# Patient Record
Sex: Male | Born: 1935 | Race: White | Hispanic: No | Smoking: Former smoker
Health system: Southern US, Community
[De-identification: ages and names within clinical notes are randomized; demographics above are authoritative.]

## PROBLEM LIST (undated history)

## (undated) DIAGNOSIS — H9190 Unspecified hearing loss, unspecified ear: Secondary | ICD-10-CM

## (undated) DIAGNOSIS — I422 Other hypertrophic cardiomyopathy: Secondary | ICD-10-CM

## (undated) DIAGNOSIS — C61 Malignant neoplasm of prostate: Secondary | ICD-10-CM

## (undated) DIAGNOSIS — E039 Hypothyroidism, unspecified: Secondary | ICD-10-CM

## (undated) DIAGNOSIS — R011 Cardiac murmur, unspecified: Secondary | ICD-10-CM

## (undated) HISTORY — PX: OTHER SURGICAL HISTORY: SHX169

## (undated) HISTORY — DX: Malignant neoplasm of prostate: C61

## (undated) HISTORY — DX: Cardiac murmur, unspecified: R01.1

## (undated) HISTORY — DX: Other hypertrophic cardiomyopathy: I42.2

## (undated) HISTORY — DX: Hypothyroidism, unspecified: E03.9

---

## 2012-05-17 ENCOUNTER — Encounter: Payer: Self-pay | Admitting: Cardiovascular Disease

## 2012-07-19 ENCOUNTER — Ambulatory Visit (INDEPENDENT_AMBULATORY_CARE_PROVIDER_SITE_OTHER): Payer: Medicare HMO | Admitting: Cardiovascular Disease

## 2012-07-19 ENCOUNTER — Encounter: Payer: Self-pay | Admitting: Cardiovascular Disease

## 2012-07-19 VITALS — BP 138/66 | HR 52 | Ht 70.0 in | Wt 177.0 lb

## 2012-07-19 DIAGNOSIS — I421 Obstructive hypertrophic cardiomyopathy: Secondary | ICD-10-CM | POA: Insufficient documentation

## 2012-07-19 NOTE — Patient Instructions (Addendum)
Your physician wants you to follow-up in: 4 months. You will receive a reminder letter in the mail two months in advance. If you don't receive a letter, please call our office to schedule the follow-up appointment.  

## 2012-07-19 NOTE — Progress Notes (Signed)
   History of Present Illness: 79 yo male with history of prostate cancer s/p brachytherapy and XRT, hypertrophic cardiomyopathy, hypothyroidism here today to establish cardiology care. He has been followed by Dr. Sharyn Creamer ([facility]) for idiopathic sub-aortic stenosis. No prior CAD or MI. Possible TIA several years ago.   He tells me that has been feeling well. No chest pain or SOB. No near syncope or syncope.   Primary Care Physician: Alysia Penna, MD [facility])  Past Medical History  Diagnosis Date  . Hypertrophic cardiomyopathy   . Hypothyroidism   . Prostate cancer   . Heart murmur     Past Surgical History  Procedure Date  . Seed implants - prostate     Current Outpatient Prescriptions  Medication Sig Dispense Refill  . bisoprolol (ZEBETA) 5 MG tablet Take 5 mg by mouth daily.      Marland Kitchen levothyroxine (SYNTHROID, LEVOTHROID) 125 MCG tablet Take 125 mcg by mouth daily.      . Tamsulosin HCl (FLOMAX) 0.4 MG CAPS Take 0.4 mg by mouth daily after supper.        No Known Allergies  History   Social History  . Marital Status: N/A    Spouse Name: N/A    Number of Children: N/A  . Years of Education: N/A   Occupational History  . Not on file.   Social History Main Topics  . Smoking status: Not on file  . Smokeless tobacco: Not on file  . Alcohol Use: Not on file  . Drug Use: Not on file  . Sexually Active: Not on file   Other Topics Concern  . Not on file   Social History Narrative  . No narrative on file    No family history on file.  Review of Systems:  As stated in the HPI and otherwise negative.   BP 138/66  Pulse 52  Ht 5\' 10"  (1.778 m)  Wt 177 lb (80.287 kg)  BMI 25.40 kg/m2  Physical Examination: General: Well developed, well nourished, NAD HEENT: OP clear, mucus membranes moist SKIN: warm, dry. No rashes. Neuro: No focal deficits Musculoskeletal: Muscle strength 5/5 all ext Psychiatric: Mood and affect  normal Neck: No JVD, no carotid bruits, no thyromegaly, no lymphadenopathy. Lungs:Clear bilaterally, no wheezes, rhonci, crackles Cardiovascular: Regular rate and rhythm. Systolic murmurs noted. No gallops or rubs. Abdomen:Soft. Bowel sounds present. Non-tender.  Extremities: No lower extremity edema. Pulses are 2 + in the bilateral DP/PT.  EKG: Sinus brady, rate 52 bpm. LBBB. 1st degree AV block.   Assessment and Plan:   1. Hypertrophic Cardiomyopathy: He has been followed in [place] by Dr. Charlestine Massed. Will get records. No testing planned at this time. He has been seen by a specialist in HOCM in [place] and conservative management planned. BP is well controlled. Will review records and plan f/u in 4 months. Will plan testing at that time.

## 2012-07-28 ENCOUNTER — Encounter: Payer: Self-pay | Admitting: Cardiovascular Disease

## 2012-11-10 ENCOUNTER — Ambulatory Visit (INDEPENDENT_AMBULATORY_CARE_PROVIDER_SITE_OTHER): Payer: Medicare HMO | Admitting: Cardiovascular Disease

## 2012-11-10 ENCOUNTER — Encounter: Payer: Self-pay | Admitting: Cardiovascular Disease

## 2012-11-10 ENCOUNTER — Telehealth: Payer: Self-pay | Admitting: Cardiovascular Disease

## 2012-11-10 VITALS — BP 151/62 | HR 50 | Ht 70.0 in | Wt 175.0 lb

## 2012-11-10 DIAGNOSIS — I421 Obstructive hypertrophic cardiomyopathy: Secondary | ICD-10-CM

## 2012-11-10 NOTE — Telephone Encounter (Signed)
Records rec From [facility], gave to [facility] 11/10/12/KM

## 2012-11-10 NOTE — Patient Instructions (Addendum)

## 2012-11-10 NOTE — Progress Notes (Signed)
   History of Present Illness: 79 yo male with history of prostate cancer s/p brachytherapy and XRT, hypertrophic cardiomyopathy, hypothyroidism here today for cardiac follow up. I saw him as a new patient January 2014. He has been followed by Dr. Sharyn Creamer ([facility] [place]) for idiopathic sub-aortic stenosis. No prior CAD or MI. Possible TIA several years ago. We have requested records multiple times from Dr. Roland Earl office but have not received records yet. He moved from [place] in June 2013 to be near his daughter in [place] and son in [place].   He is here today for follow up. No chest pain or SOB. No near syncope or syncope. He has occasional dizziness.   Primary Care Physician: Alysia Penna, MD [facility])  Past Medical History  Diagnosis Date  . Hypertrophic cardiomyopathy   . Hypothyroidism   . Prostate cancer   . Heart murmur     Past Surgical History  Procedure Laterality Date  . Seed implants - prostate      Current Outpatient Prescriptions  Medication Sig Dispense Refill  . bisoprolol (ZEBETA) 5 MG tablet Take 5 mg by mouth daily.      Marland Kitchen levothyroxine (SYNTHROID, LEVOTHROID) 125 MCG tablet Take 125 mcg by mouth daily.      . Tamsulosin HCl (FLOMAX) 0.4 MG CAPS Take 0.4 mg by mouth daily after supper.       No current facility-administered medications for this visit.    No Known Allergies  History   Social History  . Marital Status: Widowed    Spouse Name: N/A    Number of Children: 4  . Years of Education: N/A   Occupational History  . Retired    Social History Main Topics  . Smoking status: Former Smoker -- 20 years    Quit date: 07/19/1973  . Smokeless tobacco: Not on file  . Alcohol Use: 3.5 oz/week    7 drink(s) per week  . Drug Use: No  . Sexually Active: Not on file   Other Topics Concern  . Not on file   Social History Narrative  . No narrative on file    Family History  Problem Relation Age of Onset  . Diabetes Mother      Review of Systems:  As stated in the HPI and otherwise negative.   BP 151/62  Pulse 50  Ht 5\' 10"  (1.778 m)  Wt 175 lb (79.379 kg)  BMI 25.11 kg/m2  Physical Examination: General: Well developed, well nourished, NAD HEENT: OP clear, mucus membranes moist SKIN: warm, dry. No rashes. Neuro: No focal deficits Musculoskeletal: Muscle strength 5/5 all ext Psychiatric: Mood and affect normal Neck: No JVD, no carotid bruits, no thyromegaly, no lymphadenopathy. Lungs:Clear bilaterally, no wheezes, rhonci, crackles Cardiovascular: Regular rate and rhythm. No murmurs, gallops or rubs. Abdomen:Soft. Bowel sounds present. Non-tender.  Extremities: No lower extremity edema. Pulses are 2 + in the bilateral DP/PT.  Assessment and Plan:   1. Hypertrophic Cardiomyopathy: He has been followed in [place] by Dr. Charlestine Massed. I would like to get the records to review to see if we need to alter his therapy but we have been unsuccessful in our attempts. Will arrange echo to assess his LV size and function, valve disease. He has been seen by a specialist in HOCM in [place] and conservative management planned. No changes today.

## 2012-12-19 ENCOUNTER — Ambulatory Visit (HOSPITAL_COMMUNITY): Payer: Medicare HMO | Attending: Cardiology | Admitting: Radiology

## 2012-12-19 DIAGNOSIS — I422 Other hypertrophic cardiomyopathy: Secondary | ICD-10-CM | POA: Insufficient documentation

## 2012-12-19 DIAGNOSIS — I119 Hypertensive heart disease without heart failure: Secondary | ICD-10-CM | POA: Insufficient documentation

## 2012-12-19 DIAGNOSIS — I421 Obstructive hypertrophic cardiomyopathy: Secondary | ICD-10-CM

## 2012-12-19 DIAGNOSIS — Z8673 Personal history of transient ischemic attack (TIA), and cerebral infarction without residual deficits: Secondary | ICD-10-CM | POA: Insufficient documentation

## 2012-12-19 NOTE — Progress Notes (Signed)
Echocardiogram performed.  

## 2013-05-16 ENCOUNTER — Ambulatory Visit (INDEPENDENT_AMBULATORY_CARE_PROVIDER_SITE_OTHER): Payer: Medicare HMO | Admitting: Cardiovascular Disease

## 2013-05-16 ENCOUNTER — Encounter: Payer: Self-pay | Admitting: Cardiovascular Disease

## 2013-05-16 VITALS — BP 123/60 | HR 63 | Ht 70.0 in | Wt 172.4 lb

## 2013-05-16 DIAGNOSIS — I447 Left bundle-branch block, unspecified: Secondary | ICD-10-CM

## 2013-05-16 DIAGNOSIS — I421 Obstructive hypertrophic cardiomyopathy: Secondary | ICD-10-CM

## 2013-05-16 NOTE — Progress Notes (Signed)
History of Present Illness: 79 yo male  with history of prostate cancer s/p brachytherapy and XRT, hypertrophic cardiomyopathy, hypothyroidism here today for cardiac follow up. I saw him as a new patient January 2014. He has been followed by Dr. Sharyn Creamer ([facility] [place] [facility]) for idiopathic sub-aortic stenosis. No prior CAD or MI. Possible TIA several years ago. He moved from [place] in June 2013 to be near his daughter in [place] and son in [place]. Echo June 2014 with findings c/w HOCM (see below).  He is here today for follow up. No chest pain or SOB. No near syncope or syncope. He occasionally feels his heart skip beats. Lasts a few seconds. No prolonged episodes. No associated dizziness.  His name is pronounced (Juh-leen).   Primary Care Physician: Alysia Penna, MD [facility])  Past Medical History  Diagnosis Date  . Hypertrophic cardiomyopathy   . Hypothyroidism   . Prostate cancer   . Heart murmur     Past Surgical History  Procedure Laterality Date  . Seed implants - prostate      Current Outpatient Prescriptions  Medication Sig Dispense Refill  . bisoprolol (ZEBETA) 5 MG tablet Take 5 mg by mouth daily.      Marland Kitchen levothyroxine (SYNTHROID, LEVOTHROID) 125 MCG tablet Take 125 mcg by mouth daily.      . Tamsulosin HCl (FLOMAX) 0.4 MG CAPS Take 0.4 mg by mouth daily after supper.       No current facility-administered medications for this visit.    No Known Allergies  History   Social History  . Marital Status: Widowed    Spouse Name: N/A    Number of Children: 4  . Years of Education: N/A   Occupational History  . Retired    Social History Main Topics  . Smoking status: Former Smoker -- 20 years    Quit date: 07/19/1973  . Smokeless tobacco: Not on file  . Alcohol Use: 3.5 oz/week    7 drink(s) per week  . Drug Use: No  . Sexual Activity: Not on file   Other Topics Concern  . Not on file   Social History Narrative  . No narrative on  file    Family History  Problem Relation Age of Onset  . Diabetes Mother     Review of Systems:  As stated in the HPI and otherwise negative.   BP 123/60  Pulse 63  Ht 5\' 10"  (1.778 m)  Wt 172 lb 6.4 oz (78.2 kg)  BMI 24.74 kg/m2  Physical Examination: General: Well developed, well nourished, NAD HEENT: OP clear, mucus membranes moist SKIN: warm, dry. No rashes. Neuro: No focal deficits Musculoskeletal: Muscle strength 5/5 all ext Psychiatric: Mood and affect normal Neck: No JVD, no carotid bruits, no thyromegaly, no lymphadenopathy. Lungs:Clear bilaterally, no wheezes, rhonci, crackles Cardiovascular: Regular rate and rhythm. No murmurs, gallops or rubs. Abdomen:Soft. Bowel sounds present. Non-tender.  Extremities: No lower extremity edema. Pulses are 2 + in the bilateral DP/PT.  EKG: Sinus, rate 63 bpm. 1st degree AV block. LBBB.   Echo June 2014: Left ventricle: The cavity size was normal. Severe asymmetric septal hypertrophy. Systolic function was low normal to mildly reduced. The estimated ejection fraction was in the range of 50% to 55%. There was dynamic obstruction at rest and during Valsalva in the outflow tract, with a peak gradient of 55 mmHg at rest and a peak gradient of174mm Hg with Valsalva. Wall motion was normal; there were no  regional wall motion abnormalities. Doppler parameters are consistent with abnormal left ventricular relaxation (grade 1 diastolic dysfunction). Septal thickness:8mm (ED, 2D). Posterior wall thickness:72mm (ED, 2D). - Aortic valve: Trileaflet; moderately calcified leaflets. There was mild stenosis. Trivial regurgitation. Mean gradient: 17mm Hg (S). Peak gradient: 28mm Hg (S). - Aorta: Mildly dilated aortic root. Aortic root dimension: 39mm (ED). - Mitral valve: There was mitral systolic anterior motion. Moderately calcified annulus. Moderately calcified leaflets . Mild regurgitation. - Left atrium: The atrium was mildly to  moderately dilated. - Right ventricle: The cavity size was normal. Systolic function was normal. - Tricuspid valve: Peak RV-RA gradient: 25mm Hg (S). - Pulmonary arteries: PA peak pressure: 30mm Hg (S). - Inferior vena cava: The vessel was normal in size; the respirophasic diameter changes were in the normal range (= 50%); findings are consistent with normal central venous pressure. Impressions:  - Normal LV size with severe asymmetric septal hypertrophy. EF 50-55%, low normal to mildly reduced. There was mitral systolic anterior motion with mild MR and LVOT gradient to 55 mmHg at rest and 102 mmHg with Valsalva. There was mild valvular aortic stenosis. Normal RV size and systolic function. Findings consistent with hypertrophic obstructive cardiomyopathy.  Assessment and Plan:   1. Hypertrophic Cardiomyopathy: Recent echo with normal LV function, septal hypertrophy and SAM, c/w HOCM. He has been seen by a specialist in HOCM in [place] and conservative management planned. No changes today. I have encouraged him to let us know if he has any palpitations, episodes of dizziness.   2. LBBB: Chronic.

## 2013-05-16 NOTE — Patient Instructions (Signed)
Your physician wants you to follow-up in:  12 months.  You will receive a reminder letter in the mail two months in advance. If you don't receive a letter, please call our office to schedule the follow-up appointment.   

## 2014-05-24 ENCOUNTER — Ambulatory Visit (INDEPENDENT_AMBULATORY_CARE_PROVIDER_SITE_OTHER): Payer: Commercial Managed Care - HMO | Admitting: Cardiovascular Disease

## 2014-05-24 ENCOUNTER — Encounter: Payer: Self-pay | Admitting: Cardiovascular Disease

## 2014-05-24 VITALS — BP 130/56 | HR 50 | Ht 70.0 in | Wt 174.4 lb

## 2014-05-24 DIAGNOSIS — I421 Obstructive hypertrophic cardiomyopathy: Secondary | ICD-10-CM

## 2014-05-24 DIAGNOSIS — I447 Left bundle-branch block, unspecified: Secondary | ICD-10-CM

## 2014-05-24 NOTE — Progress Notes (Signed)
History of Present Illness: 80 yo male (Juh-leen) with history of prostate cancer s/p brachytherapy and XRT, hypertrophic cardiomyopathy, hypothyroidism here today for cardiac follow up. I saw him as a new patient January 2014. He has been followed by Dr. Cory Roughen ([facility] [place]) for idiopathic sub-aortic stenosis. No prior CAD or MI. Possible TIA several years ago. He moved from [place] in June 2013 to be near his daughter in [place] and son in [place]. Echo June 2014 with findings c/w HOCM.   He is here today for follow up. No chest pain or SOB. No near syncope or syncope.    Primary Care Physician: Velna Hatchet, MD ([place])  Past Medical History  Diagnosis Date  . Hypertrophic cardiomyopathy   . Hypothyroidism   . Prostate cancer   . Heart murmur     Past Surgical History  Procedure Laterality Date  . Seed implants - prostate      Current Outpatient Prescriptions  Medication Sig Dispense Refill  . bisoprolol (ZEBETA) 5 MG tablet Take 5 mg by mouth daily.    Marland Kitchen levothyroxine (SYNTHROID, LEVOTHROID) 125 MCG tablet Take 125 mcg by mouth daily.    . Tamsulosin HCl (FLOMAX) 0.4 MG CAPS Take 0.4 mg by mouth daily after supper.     No current facility-administered medications for this visit.    No Known Allergies  History   Social History  . Marital Status: Widowed    Spouse Name: N/A    Number of Children: 4  . Years of Education: N/A   Occupational History  . Retired    Social History Main Topics  . Smoking status: Former Smoker -- 20 years    Quit date: 07/19/1973  . Smokeless tobacco: Not on file  . Alcohol Use: 3.5 oz/week    7 drink(s) per week  . Drug Use: No  . Sexual Activity: Not on file   Other Topics Concern  . Not on file   Social History Narrative    Family History  Problem Relation Age of Onset  . Diabetes Mother     Review of Systems:  As stated in the HPI and otherwise negative.   BP 130/56 mmHg  Pulse 50   Ht 5\' 10"  (1.778 m)  Wt 174 lb 6.4 oz (79.107 kg)  BMI 25.02 kg/m2  SpO2 99%  Physical Examination: General: Well developed, well nourished, NAD HEENT: OP clear, mucus membranes moist SKIN: warm, dry. No rashes. Neuro: No focal deficits Musculoskeletal: Muscle strength 5/5 all ext Psychiatric: Mood and affect normal Neck: No JVD, no carotid bruits, no thyromegaly, no lymphadenopathy. Lungs:Clear bilaterally, no wheezes, rhonci, crackles Cardiovascular: Regular rate and rhythm. No murmurs, gallops or rubs. Abdomen:Soft. Bowel sounds present. Non-tender.  Extremities: No lower extremity edema. Pulses are 2 + in the bilateral DP/PT.  EKG: Sinus, rate 50 bpm. 1st degree AV block. LBBB.   Echo June 2014: Left ventricle: The cavity size was normal. Severe asymmetric septal hypertrophy. Systolic function was low normal to mildly reduced. The estimated ejection fraction was in the range of 50% to 55%. There was dynamic obstruction at rest and during Valsalva in the outflow tract, with a peak gradient of 55 mmHg at rest and a peak gradient of114mm Hg with Valsalva. Wall motion was normal; there were no regional wall motion abnormalities. Doppler parameters are consistent with abnormal left ventricular relaxation (grade 1 diastolic dysfunction). Septal thickness:62mm (ED, 2D). Posterior wall thickness:69mm (ED, 2D). - Aortic valve: Trileaflet; moderately  calcified leaflets. There was mild stenosis. Trivial regurgitation. Mean gradient: 72mm Hg (S). Peak gradient: 75mm Hg (S). - Aorta: Mildly dilated aortic root. Aortic root dimension: 103mm (ED). - Mitral valve: There was mitral systolic anterior motion. Moderately calcified annulus. Moderately calcified leaflets . Mild regurgitation. - Left atrium: The atrium was mildly to moderately dilated. - Right ventricle: The cavity size was normal. Systolic function was normal. - Tricuspid valve: Peak RV-RA gradient: 71mm Hg (S). - Pulmonary  arteries: PA peak pressure: 43mm Hg (S). - Inferior vena cava: The vessel was normal in size; the respirophasic diameter changes were in the normal range (= 50%); findings are consistent with normal central venous pressure. Impressions:  - Normal LV size with severe asymmetric septal hypertrophy. EF 50-55%, low normal to mildly reduced. There was mitral systolic anterior motion with mild MR and LVOT gradient to 55 mmHg at rest and 102 mmHg with Valsalva. There was mild valvular aortic stenosis. Normal RV size and systolic function. Findings consistent with hypertrophic obstructive cardiomyopathy.  Assessment and Plan:   1. Hypertrophic Cardiomyopathy: Echo 2014 with normal LV function, septal hypertrophy and SAM, c/w HOCM. He has been seen by a specialist in [place] in [place] and conservative management planned. He does not wish to have any future invasive testing or procedures. He states that he feels great and accepts the fact that he may not live longer than a few more years. No changes today. I have encouraged him to let us know if he has any palpitations, episodes of dizziness.   2. LBBB: Chronic.

## 2014-05-24 NOTE — Patient Instructions (Signed)
Your physician wants you to follow-up in:  12 months.  You will receive a reminder letter in the mail two months in advance. If you don't receive a letter, please call our office to schedule the follow-up appointment.   

## 2015-06-05 ENCOUNTER — Encounter: Payer: Self-pay | Admitting: Cardiovascular Disease

## 2015-06-05 ENCOUNTER — Ambulatory Visit (INDEPENDENT_AMBULATORY_CARE_PROVIDER_SITE_OTHER): Payer: Commercial Managed Care - HMO | Admitting: Cardiovascular Disease

## 2015-06-05 VITALS — BP 112/60 | HR 52 | Ht 70.0 in | Wt 172.0 lb

## 2015-06-05 DIAGNOSIS — I447 Left bundle-branch block, unspecified: Secondary | ICD-10-CM

## 2015-06-05 DIAGNOSIS — I421 Obstructive hypertrophic cardiomyopathy: Secondary | ICD-10-CM | POA: Diagnosis not present

## 2015-06-05 NOTE — Progress Notes (Signed)
Chief Complaint  Patient presents with  . Follow-up     History of Present Illness: 81 yo male (Edward Foster) with history of prostate cancer s/p brachytherapy and XRT, hypertrophic cardiomyopathy, hypothyroidism here today for cardiac follow up. I saw him as a new patient January 2014. He has been followed by Dr. Cory Roughen ([facility] [place]) for idiopathic sub-aortic stenosis. No prior CAD or MI. Possible TIA several years ago. He moved from [place] in June 2013 to be near his daughter in [place] and son in [place]. Echo June 2014 with findings c/w HOCM.   He is here today for follow up. No chest pain or SOB. No near syncope or syncope.    Primary Care Physician: Velna Hatchet, MD ([place])  Past Medical History  Diagnosis Date  . Hypertrophic cardiomyopathy ([place])   . Hypothyroidism   . Prostate cancer ([place])   . Heart murmur     Past Surgical History  Procedure Laterality Date  . Seed implants - prostate      Current Outpatient Prescriptions  Medication Sig Dispense Refill  . bisoprolol (ZEBETA) 5 MG tablet Take 5 mg by mouth daily.    Marland Kitchen levothyroxine (SYNTHROID, LEVOTHROID) 125 MCG tablet Take 125 mcg by mouth daily.    . Tamsulosin HCl (FLOMAX) 0.4 MG CAPS Take 0.4 mg by mouth daily after supper.     No current facility-administered medications for this visit.    No Known Allergies  Social History   Social History  . Marital Status: Widowed    Spouse Name: N/A  . Number of Children: 4  . Years of Education: N/A   Occupational History  . Retired    Social History Main Topics  . Smoking status: Former Smoker -- 20 years    Quit date: 07/19/1973  . Smokeless tobacco: Not on file  . Alcohol Use: 3.5 oz/week    7 drink(s) per week  . Drug Use: No  . Sexual Activity: Not on file   Other Topics Concern  . Not on file   Social History Narrative    Family History  Problem Relation Age of Onset  . Diabetes Mother     Review of Systems:   As stated in the HPI and otherwise negative.   BP 112/60 mmHg  Pulse 52  Ht 5\' 10"  (1.778 m)  Wt 172 lb (78.019 kg)  BMI 24.68 kg/m2  Physical Examination: General: Well developed, well nourished, NAD HEENT: OP clear, mucus membranes moist SKIN: warm, dry. No rashes. Neuro: No focal deficits Musculoskeletal: Muscle strength 5/5 all ext Psychiatric: Mood and affect normal Neck: No JVD, no carotid bruits, no thyromegaly, no lymphadenopathy. Lungs:Clear bilaterally, no wheezes, rhonci, crackles Cardiovascular: Regular rate and rhythm. Systolic murmur noted. No gallops or rubs. Abdomen:Soft. Bowel sounds present. Non-tender.  Extremities: No lower extremity edema. Pulses are 2 + in the bilateral DP/PT.  EKG: Sinus, rate 52 bpm. 1st degree AV block. LBBB.   Echo June 2014: Left ventricle: The cavity size was normal. Severe asymmetric septal hypertrophy. Systolic function was low normal to mildly reduced. The estimated ejection fraction was in the range of 50% to 55%. There was dynamic obstruction at rest and during Valsalva in the outflow tract, with a peak gradient of 55 mmHg at rest and a peak gradient of19mm Hg with Valsalva. Wall motion was normal; there were no regional wall motion abnormalities. Doppler parameters are consistent with abnormal left ventricular relaxation (grade 1 diastolic dysfunction). Septal thickness:79mm (ED,  2D). Posterior wall thickness:57mm (ED, 2D). - Aortic valve: Trileaflet; moderately calcified leaflets. There was mild stenosis. Trivial regurgitation. Mean gradient: 60mm Hg (S). Peak gradient: 45mm Hg (S). - Aorta: Mildly dilated aortic root. Aortic root dimension: 34mm (ED). - Mitral valve: There was mitral systolic anterior motion. Moderately calcified annulus. Moderately calcified leaflets . Mild regurgitation. - Left atrium: The atrium was mildly to moderately dilated. - Right ventricle: The cavity size was normal. Systolic function was  normal. - Tricuspid valve: Peak RV-RA gradient: 16mm Hg (S). - Pulmonary arteries: PA peak pressure: 1mm Hg (S). - Inferior vena cava: The vessel was normal in size; the respirophasic diameter changes were in the normal range (= 50%); findings are consistent with normal central venous pressure. Impressions:  - Normal LV size with severe asymmetric septal hypertrophy. EF 50-55%, low normal to mildly reduced. There was mitral systolic anterior motion with mild MR and LVOT gradient to 55 mmHg at rest and 102 mmHg with Valsalva. There was mild valvular aortic stenosis. Normal RV size and systolic function. Findings consistent with hypertrophic obstructive cardiomyopathy.  EKG:  EKG is ordered today. The ekg ordered today demonstrates sinus brady, rate 52 bpm. 1st degree AV block. LBBB  Recent Labs: No results found for requested labs within last 365 days.   Lipid Panel No results found for: CHOL, TRIG, HDL, CHOLHDL, VLDL, LDLCALC, LDLDIRECT   Wt Readings from Last 3 Encounters:  06/05/15 172 lb (78.019 kg)  05/24/14 174 lb 6.4 oz (79.107 kg)  05/16/13 172 lb 6.4 oz (78.2 kg)     Other studies Reviewed: Additional studies/ records that were reviewed today include: . Review of the above records demonstrates:    Assessment and Plan:   1. Hypertrophic Cardiomyopathy: Echo 2014 with normal LV function, septal hypertrophy and SAM, c/w HOCM. He has been seen by a specialist in [place] in [place] and conservative management planned. He does not wish to have any future invasive testing or procedures. He states that he feels great. No changes today. Will continue beta blocker and he will continue to push po fluids. I have encouraged him to let us know if he has any palpitations, episodes of dizziness.   2. LBBB: Chronic.   Current medicines are reviewed at length with the patient today.  The patient does not have concerns regarding medicines.  The following changes have been made:  no  change  Labs/ tests ordered today include:   Orders Placed This Encounter  Procedures  . EKG 12-Lead    Disposition:   FU with me in 12  months  Signed, Lauree [place], MD 06/05/2015 3:52 PM    [facility] [address], [place], [place]  [address] Phone: [phone]; Fax: [phone]

## 2015-06-05 NOTE — Patient Instructions (Signed)

## 2015-07-16 DIAGNOSIS — L821 Other seborrheic keratosis: Secondary | ICD-10-CM | POA: Diagnosis not present

## 2015-07-16 DIAGNOSIS — D1801 Hemangioma of skin and subcutaneous tissue: Secondary | ICD-10-CM | POA: Diagnosis not present

## 2015-07-16 DIAGNOSIS — D225 Melanocytic nevi of trunk: Secondary | ICD-10-CM | POA: Diagnosis not present

## 2015-07-16 DIAGNOSIS — L814 Other melanin hyperpigmentation: Secondary | ICD-10-CM | POA: Diagnosis not present

## 2015-07-24 DIAGNOSIS — H60331 Swimmer's ear, right ear: Secondary | ICD-10-CM | POA: Diagnosis not present

## 2015-07-24 DIAGNOSIS — H903 Sensorineural hearing loss, bilateral: Secondary | ICD-10-CM | POA: Diagnosis not present

## 2015-08-07 DIAGNOSIS — R0982 Postnasal drip: Secondary | ICD-10-CM | POA: Diagnosis not present

## 2015-08-07 DIAGNOSIS — J310 Chronic rhinitis: Secondary | ICD-10-CM | POA: Diagnosis not present

## 2015-08-07 DIAGNOSIS — H60331 Swimmer's ear, right ear: Secondary | ICD-10-CM | POA: Diagnosis not present

## 2016-03-11 DIAGNOSIS — R32 Unspecified urinary incontinence: Secondary | ICD-10-CM | POA: Diagnosis not present

## 2016-03-11 DIAGNOSIS — C61 Malignant neoplasm of prostate: Secondary | ICD-10-CM | POA: Diagnosis not present

## 2016-03-11 DIAGNOSIS — N401 Enlarged prostate with lower urinary tract symptoms: Secondary | ICD-10-CM | POA: Diagnosis not present

## 2016-03-12 DIAGNOSIS — H353131 Nonexudative age-related macular degeneration, bilateral, early dry stage: Secondary | ICD-10-CM | POA: Diagnosis not present

## 2016-03-12 DIAGNOSIS — H52223 Regular astigmatism, bilateral: Secondary | ICD-10-CM | POA: Diagnosis not present

## 2016-03-12 DIAGNOSIS — H524 Presbyopia: Secondary | ICD-10-CM | POA: Diagnosis not present

## 2016-03-12 DIAGNOSIS — Z961 Presence of intraocular lens: Secondary | ICD-10-CM | POA: Diagnosis not present

## 2016-03-12 DIAGNOSIS — H5212 Myopia, left eye: Secondary | ICD-10-CM | POA: Diagnosis not present

## 2016-03-12 DIAGNOSIS — H5201 Hypermetropia, right eye: Secondary | ICD-10-CM | POA: Diagnosis not present

## 2016-03-12 DIAGNOSIS — H353132 Nonexudative age-related macular degeneration, bilateral, intermediate dry stage: Secondary | ICD-10-CM | POA: Diagnosis not present

## 2016-04-04 DIAGNOSIS — Z23 Encounter for immunization: Secondary | ICD-10-CM | POA: Diagnosis not present

## 2016-07-14 DIAGNOSIS — Z125 Encounter for screening for malignant neoplasm of prostate: Secondary | ICD-10-CM | POA: Diagnosis not present

## 2016-07-14 DIAGNOSIS — E781 Pure hyperglyceridemia: Secondary | ICD-10-CM | POA: Diagnosis not present

## 2016-07-14 DIAGNOSIS — E038 Other specified hypothyroidism: Secondary | ICD-10-CM | POA: Diagnosis not present

## 2016-07-14 DIAGNOSIS — I10 Essential (primary) hypertension: Secondary | ICD-10-CM | POA: Diagnosis not present

## 2016-07-14 DIAGNOSIS — M859 Disorder of bone density and structure, unspecified: Secondary | ICD-10-CM | POA: Diagnosis not present

## 2016-07-31 DIAGNOSIS — M353 Polymyalgia rheumatica: Secondary | ICD-10-CM | POA: Diagnosis not present

## 2016-07-31 DIAGNOSIS — Z0000 Encounter for general adult medical examination without abnormal findings: Secondary | ICD-10-CM | POA: Diagnosis not present

## 2016-07-31 DIAGNOSIS — Z6825 Body mass index (BMI) 25.0-25.9, adult: Secondary | ICD-10-CM | POA: Diagnosis not present

## 2016-07-31 DIAGNOSIS — N183 Chronic kidney disease, stage 3 (moderate): Secondary | ICD-10-CM | POA: Diagnosis not present

## 2016-07-31 DIAGNOSIS — I10 Essential (primary) hypertension: Secondary | ICD-10-CM | POA: Diagnosis not present

## 2016-07-31 DIAGNOSIS — M859 Disorder of bone density and structure, unspecified: Secondary | ICD-10-CM | POA: Diagnosis not present

## 2016-07-31 DIAGNOSIS — E038 Other specified hypothyroidism: Secondary | ICD-10-CM | POA: Diagnosis not present

## 2016-07-31 DIAGNOSIS — N400 Enlarged prostate without lower urinary tract symptoms: Secondary | ICD-10-CM | POA: Diagnosis not present

## 2016-07-31 DIAGNOSIS — G629 Polyneuropathy, unspecified: Secondary | ICD-10-CM | POA: Diagnosis not present

## 2016-07-31 DIAGNOSIS — I421 Obstructive hypertrophic cardiomyopathy: Secondary | ICD-10-CM | POA: Diagnosis not present

## 2016-07-31 DIAGNOSIS — Z23 Encounter for immunization: Secondary | ICD-10-CM | POA: Diagnosis not present

## 2016-07-31 DIAGNOSIS — Z1389 Encounter for screening for other disorder: Secondary | ICD-10-CM | POA: Diagnosis not present

## 2016-08-12 DIAGNOSIS — H903 Sensorineural hearing loss, bilateral: Secondary | ICD-10-CM | POA: Diagnosis not present

## 2016-08-12 DIAGNOSIS — J310 Chronic rhinitis: Secondary | ICD-10-CM | POA: Diagnosis not present

## 2016-08-12 DIAGNOSIS — R0982 Postnasal drip: Secondary | ICD-10-CM | POA: Diagnosis not present

## 2016-08-12 DIAGNOSIS — H6121 Impacted cerumen, right ear: Secondary | ICD-10-CM | POA: Diagnosis not present

## 2016-11-03 NOTE — Progress Notes (Signed)
Cardiology Office Note:    Date:  11/04/2016   ID:  Edward Foster, DOB 21-Oct-1922, MRN [id number]  PCP:  Edward Hatchet, MD  Cardiologist:  Dr. Lauree Foster   Electrophysiologist:  n/a  Referring MD: Edward Hatchet, MD   Chief Complaint  Patient presents with  . Near Syncope  . Chest Pain    History of Present Illness:    Edward Foster is a 83 y.o. male with a hx of prostate cancer s/p brachytherapy and XRT, hypertrophic obstructive cardiomyopathy, hypothyroidism, possible previous TIA.  Previously followed in [place] for idiopathic subaortic stenosis. He established with Dr. Angelena Foster in 1/14 after moving from [place]. Last seen by Dr. Angelena Foster 11/16.  He returns today for evaluation of near syncope and chest pain.  He was in his USOH until about 1 month ago.  He started taking a new herbal supplement around that time for his bladder issues.  It is called Urivax.  He has noted some "uncomfortable" or "empty" feeling in his chest with assoc dizziness while walking.  He denies assoc shortness of breath, nausea, diaphoresis.  He stood up from his chair about 2 weeks ago and felt dizzy.  He then proceeded to fall to the ground and says he "blacked out." But, he remembers sliding down the wall to the ground and knocking over a table, breaking a glass globe.  He denies any syncope since that time.  He denies fevers, chills, cough, melena, hematochezia, vomiting, diarrhea.    Prior CV studies:   The following studies were reviewed today:  Echo 6/14 Severe asymmetric septal hypertrophy, posterior wall thickness 9 mm, EF 50-55, dynamic obstruction at rest and during Valsalva (peak 55 at rest and peak 102 with Valsalva), normal wall motion, grade 1 diastolic dysfunction, mild aortic stenosis (mean 17, peak 28), mildly dilated aortic root (39 mm), mild SAM, moderate MAC, mild MR, mild to moderate LAE, PASP 30  Past Medical History:  Diagnosis Date  . Heart murmur   . Hypertrophic  cardiomyopathy ([place])   . Hypothyroidism   . Prostate cancer [facility])     Past Surgical History:  Procedure Laterality Date  . SEED IMPLANTS - PROSTATE      Current Medications: Current Meds  Medication Sig  . bisoprolol (ZEBETA) 5 MG tablet Take 5 mg by mouth daily.  Marland Kitchen levothyroxine (SYNTHROID, LEVOTHROID) 125 MCG tablet Take 125 mcg by mouth daily.  . [DISCONTINUED] OVER THE COUNTER MEDICATION Take 2 capsules by mouth daily. NAME OF OTC IS URIVAX USED FOR URINARY SUPPORRT     Allergies:   Patient has no known allergies.   Social History   Social History  . Marital status: Widowed    Spouse name: N/A  . Number of children: 4  . Years of education: N/A   Occupational History  . Retired    Social History Main Topics  . Smoking status: Former Smoker    Years: 20.00    Quit date: 07/19/1973  . Smokeless tobacco: Former Systems developer  . Alcohol use 3.5 oz/week    7 Standard drinks or equivalent per week  . Drug use: No  . Sexual activity: Not Asked   Other Topics Concern  . None   Social History Narrative  . None     Family History  Problem Relation Age of Onset  . Diabetes Mother      ROS:   Please see the history of present illness.    ROS All other systems reviewed and are  negative.   EKGs/Labs/Other Test Reviewed:    EKG:  EKG is  ordered today.  The ekg ordered today demonstrates sinus brady, HR 53, LBBB  Recent Labs: No results found for requested labs within last 8760 hours.   Recent Lipid Panel No results found for: CHOL, TRIG, HDL, CHOLHDL, VLDL, LDLCALC, LDLDIRECT   Physical Exam:    VS:  BP (!) 124/50   Pulse (!) 53   Ht 5' 10"  (1.778 m)   Wt 174 lb (78.9 kg)   BMI 24.97 kg/m     Orthostatic VS for the past 24 hrs (Last 3 readings):  BP- Lying Pulse- Lying BP- Sitting Pulse- Sitting BP- Standing at 0 minutes Pulse- Standing at 0 minutes BP- Standing at 3 minutes Pulse- Standing at 3 minutes  11/04/16 0953 124/60 (!) 48 107/51 (!) 47 100/45 (!)  49 98/49 (!) 48    Wt Readings from Last 3 Encounters:  11/04/16 174 lb (78.9 kg)  06/05/15 172 lb (78 kg)  05/24/14 174 lb 6.4 oz (79.1 kg)     Physical Exam  Constitutional: He is oriented to person, place, and time. He appears well-developed and well-nourished. No distress.  HENT:  Head: Normocephalic and atraumatic.  Eyes: No scleral icterus.  Neck: Normal range of motion. No JVD present.  Cardiovascular: Normal rate, regular rhythm, S1 normal and S2 normal.   Murmur heard.  Medium-pitched holosystolic murmur is present with a grade of 2/6  at the upper right sternal border, upper left sternal border, lower left sternal border Pulmonary/Chest: Effort normal and breath sounds normal. He has no wheezes. He has no rhonchi. He has no rales.  Abdominal: Soft. There is no tenderness.  Musculoskeletal: He exhibits no edema.  Neurological: He is alert and oriented to person, place, and time.  Skin: Skin is warm and dry.  Psychiatric: He has a normal mood and affect.    ASSESSMENT:    1. Near syncope   2. Other chest pain   3. HOCM (hypertrophic obstructive cardiomyopathy) ([place])    PLAN:    In order of problems listed above:  1. Near syncope - Etiology not entirely clear. His symptoms sound more orthostatic in nature than anything. He did not have true syncope. Orthostatic vital signs today do show a significant drop in his BP from lying to standing.  He recently started on an herbal supplement for his bladder. This does have anticholinergic effects. It is an over-the-counter remedy and therefore we cannot be sure how much active ingredient he is getting. I am concerned that this may have contributed to his recent symptomatology. I have asked him to stop taking this formulation. He is bradycardic and his conduction abnormality on ECG is certainly concerning for leading to high-grade heart block. However, his heart rate and ECG have remained stable since last seen. I reviewed his case  with Dr. Tamala Julian ([facility]). We would like to avoid decreasing his beta blocker if at all possible to avoid increasing his LVOT gradient. Therefore, we will proceed with an echocardiogram to reassess his LV function and LVOT gradient as well as a 24-hour Holter monitor to assess for pauses, high-grade heart block or nonsustained ventricular tachycardia. He will come back for close follow-up with Dr. Angelena Foster in the next 4 weeks.  -  DC Urivax supplement  -  Echo  -  24 hour holter  -  Push fluids  -  OTC compression stockings  -  FU 1 month  2. Other chest  pain - Atypical symptoms. He has a chronic LBBB. Given his advanced age, I do not think that proceeding with stress testing would be advised. He is not interested in invasive evaluation. Proceed with echocardiogram and monitor as noted above. If chest symptoms progress, we may need to consider stress testing.  3. HOCM (hypertrophic obstructive cardiomyopathy) ([place]) - He has not had an echocardiogram since 2014. Proceed with echo as noted above. Proceed with Holter monitor as noted. Continue to hydrate well. Avoid OTC herbal supplements for bladder as outlined above.  Total time spent with patient today 45 minutes. This includes reviewing records, evaluating the patient and coordinating care. Face-to-face time >50%.   Dispo:  Return in about 4 weeks (around 12/02/2016) for Close Follow Up with Dr. Angelena Foster.   Medication Adjustments/Labs and Tests Ordered: Current medicines are reviewed at length with the patient today.  Concerns regarding medicines are outlined above.  Orders placed this visit:  Orders Placed This Encounter  Procedures  . Holter monitor - 24 hour  . EKG 12-Lead  . ECHOCARDIOGRAM COMPLETE   Medication changes this visit: No orders of the defined types were placed in this encounter.   Signed, Richardson Dopp, PA-C  11/04/2016 9:07 PM    [facility] [place], [place], [place]  [address] Phone: [phone]; Fax: [phone]

## 2016-11-04 ENCOUNTER — Ambulatory Visit (INDEPENDENT_AMBULATORY_CARE_PROVIDER_SITE_OTHER): Payer: PPO | Admitting: Physician Assistant

## 2016-11-04 ENCOUNTER — Encounter: Payer: Self-pay | Admitting: Physician Assistant

## 2016-11-04 VITALS — BP 124/50 | HR 53 | Ht 70.0 in | Wt 174.0 lb

## 2016-11-04 DIAGNOSIS — I421 Obstructive hypertrophic cardiomyopathy: Secondary | ICD-10-CM

## 2016-11-04 DIAGNOSIS — R55 Syncope and collapse: Secondary | ICD-10-CM

## 2016-11-04 DIAGNOSIS — R0789 Other chest pain: Secondary | ICD-10-CM | POA: Diagnosis not present

## 2016-11-04 NOTE — Patient Instructions (Addendum)
Medication Instructions:  1. STOP URIVAX OTC HERBAL SUPPLEMENT   Labwork: NONE ORDERED   Testing/Procedures: 1. Your physician has requested that you have an echocardiogram. Echocardiography is a painless test that uses sound waves to create images of your heart. It provides your doctor with information about the size and shape of your heart and how well your heart's chambers and valves are working. This procedure takes approximately one hour. There are no restrictions for this procedure.  2. Your physician has recommended that you wear a 24 HOUR holter monitor. Holter monitors are medical devices that record the heart's electrical activity. Doctors most often use these monitors to diagnose arrhythmias. Arrhythmias are problems with the speed or rhythm of the heartbeat. The monitor is a small, portable device. You can wear one while you do your normal daily activities. This is usually used to diagnose what is causing palpitations/syncope (passing out).    Follow-Up: DR. Angelena Form ON 12/28/16 @ 9:40  Any Other Special Instructions Will Be Listed Below (If Applicable). INCREASE FLUID INTAKE; WATER, JUICE, LIMIT CAFFEINE   GET UP SLOWLY WHEN STANDING  PICK UP OTC COMPRESSION STOCKINGS; WEAR THESE FROM THE TIME YOU WAKE UP UNTIL YOU GO TO BED    If you need a refill on your cardiac medications before your next appointment, please call your pharmacy.

## 2016-11-16 ENCOUNTER — Other Ambulatory Visit: Payer: Self-pay | Admitting: Physician Assistant

## 2016-11-16 ENCOUNTER — Other Ambulatory Visit: Payer: Self-pay

## 2016-11-16 ENCOUNTER — Ambulatory Visit (INDEPENDENT_AMBULATORY_CARE_PROVIDER_SITE_OTHER): Payer: PPO

## 2016-11-16 ENCOUNTER — Ambulatory Visit (HOSPITAL_COMMUNITY): Payer: PPO | Attending: Cardiology

## 2016-11-16 DIAGNOSIS — Z87891 Personal history of nicotine dependence: Secondary | ICD-10-CM | POA: Diagnosis not present

## 2016-11-16 DIAGNOSIS — I421 Obstructive hypertrophic cardiomyopathy: Secondary | ICD-10-CM

## 2016-11-16 DIAGNOSIS — I352 Nonrheumatic aortic (valve) stenosis with insufficiency: Secondary | ICD-10-CM | POA: Insufficient documentation

## 2016-11-16 DIAGNOSIS — R55 Syncope and collapse: Secondary | ICD-10-CM | POA: Diagnosis not present

## 2016-11-16 DIAGNOSIS — I517 Cardiomegaly: Secondary | ICD-10-CM | POA: Diagnosis not present

## 2016-11-16 DIAGNOSIS — I348 Other nonrheumatic mitral valve disorders: Secondary | ICD-10-CM | POA: Insufficient documentation

## 2016-11-17 ENCOUNTER — Encounter: Payer: Self-pay | Admitting: Physician Assistant

## 2016-11-23 ENCOUNTER — Telehealth: Payer: Self-pay | Admitting: Cardiovascular Disease

## 2016-11-23 NOTE — Telephone Encounter (Signed)
I spoke with pt and reviewed monitor results with him.  

## 2016-11-23 NOTE — Telephone Encounter (Signed)
Follow up     Pt is returning [place] call

## 2016-12-27 NOTE — Progress Notes (Signed)
Chief Complaint  Patient presents with  . Follow-up     History of Present Illness: 83 yo Foster (last name is pronounced Juh-leen) with history of prostate cancer s/p brachytherapy and XRT, hypertrophic cardiomyopathy, hypothyroidism who is here today for cardiac follow up. I saw him as a new patient January 2014. He has been followed by Dr. Cory Roughen ([facility] [place]) for idiopathic sub-aortic stenosis. No prior CAD or MI. He moved from [place] in June 2013 to be near his daughter in [place] and son in [place]. He was seen in our office 11/04/16 after a near syncopal event after starting a herbal supplement for his bladder. This medication was stopped due to anticholinergic side effects. Echo May 2018 with normal LV function, severe concentric LVH, mild AS, overall unchanged.  24 hour cardiac monitor with sinus rhythm, PVCs, no long pauses.   He is here today for follow up. The patient denies any chest pain, dyspnea, palpitations, lower extremity edema, orthopnea, PND. He has had no recurrent dizziness, near syncope or syncope.   Primary Care Physician: Velna Hatchet, MD  Past Medical History:  Diagnosis Date  . Heart murmur   . Hypertrophic cardiomyopathy ([place])    Echo 5/18: severe conc LVH, EF 50-Edward, LVOT gradient not well characterized but peak velocity 2.5 m/sec; mild to mod AS (mean 19, peak 33), mild AI, severe post MAC, mod LAE, PFO cannot be excluded  . Hypothyroidism   . Prostate cancer [facility])     Past Surgical History:  Procedure Laterality Date  . SEED IMPLANTS - PROSTATE      Current Outpatient Prescriptions  Medication Sig Dispense Refill  . aspirin EC 81 MG tablet Take 81 mg by mouth every other day.    . bisoprolol (ZEBETA) 5 MG tablet Take 5 mg by mouth daily.    . Calcium Carbonate (CALCIUM 600 PO) Take by mouth daily.    . cholecalciferol (VITAMIN D) 400 units TABS tablet Take 400 Units by mouth daily.    Marland Kitchen levothyroxine (SYNTHROID, LEVOTHROID) 125 MCG tablet  Take 125 mcg by mouth daily.     No current facility-administered medications for this visit.     No Known Allergies  Social History   Social History  . Marital status: Widowed    Spouse name: N/A  . Number of children: 4  . Years of education: N/A   Occupational History  . Retired    Social History Main Topics  . Smoking status: Former Smoker    Years: 20.00    Quit date: 07/19/1973  . Smokeless tobacco: Former Systems developer  . Alcohol use 3.5 oz/week    7 Standard drinks or equivalent per week  . Drug use: No  . Sexual activity: Not on file   Other Topics Concern  . Not on file   Social History Narrative  . No narrative on file    Family History  Problem Relation Age of Onset  . Diabetes Mother     Review of Systems:  As stated in the HPI and otherwise negative.   BP 118/60   Pulse (!) 58   Ht _0  (1.778 m)   Wt 170 lb (77.1 kg)   BMI 24.39 kg/m   Physical Examination: General: Well developed, well nourished, NAD  HEENT: OP clear, mucus membranes moist  SKIN: warm, dry. No rashes. Neuro: No focal deficits  Musculoskeletal: Muscle strength 5/5 all ext  Psychiatric: Mood and affect normal  Neck: No JVD, no carotid  bruits, no thyromegaly, no lymphadenopathy.  Lungs:Clear bilaterally, no wheezes, rhonci, crackles Cardiovascular: Regular rate and rhythm. systolic murmur present.  Abdomen:Soft. Bowel sounds present. Non-tender.  Extremities: No lower extremity edema. Pulses are 2 + in the bilateral DP/PT    Echo May 2018: Left ventricle: SAM LVOT gradient not well characterized but peak   velocity in the 2.5 m/sec range. There was severe concentric   hypertrophy. Systolic function was normal. The estimated ejection   fraction was in the range of 50% to Edward%. Doppler parameters are   consistent with abnormal left ventricular relaxation (grade 1   diastolic dysfunction). - Aortic valve: There was mild to moderate stenosis. There was mild   regurgitation. Valve  area (VTI): 2.3 cm^2. Valve area (Vmax):   2.61 cm^2. Valve area (Vmean): 2.59 cm^2. - Mitral valve: Severe posterior annular calcification. Valve area   by pressure half-time: 2.5 cm^2. Valve area by continuity   equation (using LVOT flow): 3.73 cm^2. - Left atrium: The atrium was moderately dilated. - Atrial septum: A patent foramen ovale cannot be excluded.  EKG:  EKG is not ordered today. The ekg ordered today demonstrates   Recent Labs: No results found for requested labs within last 8760 hours.   Lipid Panel No results found for: CHOL, TRIG, HDL, CHOLHDL, VLDL, LDLCALC, LDLDIRECT   Wt Readings from Last 3 Encounters:  12/28/16 170 lb (77.1 kg)  11/04/16 174 lb (78.9 kg)  06/05/15 172 lb (78 kg)     Other studies Reviewed: Additional studies/ records that were reviewed today include: . Review of the above records demonstrates:    Assessment and Plan:   1. Hypertrophic Cardiomyopathy: Echo May 2018 with findings of HOCM, normal LV systolic function, unchanged. He has had previous workup by a HOCM specialist in [place]. Continue conservative approach. He does not desire further invasive testing. He feels well overall. Continue beta blocker.    2. LBBB: chronic finding  3 Near syncope: No further dizziness since stopping the herbal supplement. Encourage po intake.   Current medicines are reviewed at length with the patient today.  The patient does not have concerns regarding medicines.  The following changes have been made:  no change  Labs/ tests ordered today include:   No orders of the defined types were placed in this encounter.   Disposition:   FU with me in 12  months  Signed, Lauree Chandler, MD 12/28/2016 11:25 AM    [facility] [place], [place], [place]  [address] Phone: [phone]; Fax: [phone]

## 2016-12-28 ENCOUNTER — Ambulatory Visit (INDEPENDENT_AMBULATORY_CARE_PROVIDER_SITE_OTHER): Payer: PPO | Admitting: Cardiovascular Disease

## 2016-12-28 VITALS — BP 118/60 | HR 58 | Ht 70.0 in | Wt 170.0 lb

## 2016-12-28 DIAGNOSIS — R55 Syncope and collapse: Secondary | ICD-10-CM

## 2016-12-28 DIAGNOSIS — I421 Obstructive hypertrophic cardiomyopathy: Secondary | ICD-10-CM

## 2016-12-28 DIAGNOSIS — I447 Left bundle-branch block, unspecified: Secondary | ICD-10-CM | POA: Diagnosis not present

## 2016-12-28 NOTE — Patient Instructions (Signed)

## 2017-03-17 DIAGNOSIS — C61 Malignant neoplasm of prostate: Secondary | ICD-10-CM | POA: Diagnosis not present

## 2017-03-17 DIAGNOSIS — R32 Unspecified urinary incontinence: Secondary | ICD-10-CM | POA: Diagnosis not present

## 2017-03-18 DIAGNOSIS — H353131 Nonexudative age-related macular degeneration, bilateral, early dry stage: Secondary | ICD-10-CM | POA: Diagnosis not present

## 2017-08-10 DIAGNOSIS — M859 Disorder of bone density and structure, unspecified: Secondary | ICD-10-CM | POA: Diagnosis not present

## 2017-08-10 DIAGNOSIS — N183 Chronic kidney disease, stage 3 (moderate): Secondary | ICD-10-CM | POA: Diagnosis not present

## 2017-08-10 DIAGNOSIS — I10 Essential (primary) hypertension: Secondary | ICD-10-CM | POA: Diagnosis not present

## 2017-08-10 DIAGNOSIS — R82998 Other abnormal findings in urine: Secondary | ICD-10-CM | POA: Diagnosis not present

## 2017-08-10 DIAGNOSIS — Z125 Encounter for screening for malignant neoplasm of prostate: Secondary | ICD-10-CM | POA: Diagnosis not present

## 2017-08-10 DIAGNOSIS — E038 Other specified hypothyroidism: Secondary | ICD-10-CM | POA: Diagnosis not present

## 2017-08-17 DIAGNOSIS — L988 Other specified disorders of the skin and subcutaneous tissue: Secondary | ICD-10-CM | POA: Diagnosis not present

## 2017-08-17 DIAGNOSIS — Z1389 Encounter for screening for other disorder: Secondary | ICD-10-CM | POA: Diagnosis not present

## 2017-08-17 DIAGNOSIS — H3530 Unspecified macular degeneration: Secondary | ICD-10-CM | POA: Diagnosis not present

## 2017-08-17 DIAGNOSIS — G5621 Lesion of ulnar nerve, right upper limb: Secondary | ICD-10-CM | POA: Diagnosis not present

## 2017-08-17 DIAGNOSIS — Z0000 Encounter for general adult medical examination without abnormal findings: Secondary | ICD-10-CM | POA: Diagnosis not present

## 2017-08-17 DIAGNOSIS — N183 Chronic kidney disease, stage 3 (moderate): Secondary | ICD-10-CM | POA: Diagnosis not present

## 2017-08-17 DIAGNOSIS — M859 Disorder of bone density and structure, unspecified: Secondary | ICD-10-CM | POA: Diagnosis not present

## 2017-08-17 DIAGNOSIS — Z6825 Body mass index (BMI) 25.0-25.9, adult: Secondary | ICD-10-CM | POA: Diagnosis not present

## 2017-08-17 DIAGNOSIS — H9201 Otalgia, right ear: Secondary | ICD-10-CM | POA: Diagnosis not present

## 2017-08-17 DIAGNOSIS — R32 Unspecified urinary incontinence: Secondary | ICD-10-CM | POA: Diagnosis not present

## 2017-08-17 DIAGNOSIS — G6289 Other specified polyneuropathies: Secondary | ICD-10-CM | POA: Diagnosis not present

## 2017-08-17 DIAGNOSIS — M2560 Stiffness of unspecified joint, not elsewhere classified: Secondary | ICD-10-CM | POA: Diagnosis not present

## 2017-08-19 DIAGNOSIS — Z1212 Encounter for screening for malignant neoplasm of rectum: Secondary | ICD-10-CM | POA: Diagnosis not present

## 2017-08-30 DIAGNOSIS — L821 Other seborrheic keratosis: Secondary | ICD-10-CM | POA: Diagnosis not present

## 2017-08-30 DIAGNOSIS — L820 Inflamed seborrheic keratosis: Secondary | ICD-10-CM | POA: Diagnosis not present

## 2018-01-10 ENCOUNTER — Ambulatory Visit: Payer: PPO | Admitting: Cardiovascular Disease

## 2018-01-10 ENCOUNTER — Encounter: Payer: Self-pay | Admitting: Cardiovascular Disease

## 2018-01-10 VITALS — BP 130/60 | HR 48 | Ht 70.0 in | Wt 171.4 lb

## 2018-01-10 DIAGNOSIS — I447 Left bundle-branch block, unspecified: Secondary | ICD-10-CM | POA: Diagnosis not present

## 2018-01-10 DIAGNOSIS — I421 Obstructive hypertrophic cardiomyopathy: Secondary | ICD-10-CM | POA: Diagnosis not present

## 2018-01-10 NOTE — Patient Instructions (Signed)

## 2018-01-10 NOTE — Progress Notes (Signed)
Chief Complaint  Patient presents with  . Follow-up    Hypertrophic cardiomyopathy     History of Present Illness: 84 yo male (last name is pronounced Juh-leen) with history of prostate cancer s/p brachytherapy and XRT, hypertrophic cardiomyopathy, hypothyroidism who is here today for cardiac follow up. I saw him as a new patient January 2014. He has been followed by Dr. Cory Roughen ([facility] [place]) for idiopathic sub-aortic stenosis. No prior CAD or MI. He moved from [place] in June 2013 to be near his daughter in [place] and son in [place]. He was seen in our office 11/04/16 after a near syncopal event after starting a herbal supplement for his bladder. This medication was stopped due to anticholinergic side effects. Echo May 2018 with normal LV function, severe concentric LVH, mild AS, overall unchanged.  24 hour cardiac monitor with sinus rhythm, PVCs, no long pauses.   He is here today for follow up. The patient denies any chest pain, dyspnea, palpitations, lower extremity edema, orthopnea, PND, dizziness, near syncope or syncope.   Primary Care Physician: Velna Hatchet, MD  Past Medical History:  Diagnosis Date  . Heart murmur   . Hypertrophic cardiomyopathy ([place])    Echo 5/18: severe conc LVH, EF 50-55, LVOT gradient not well characterized but peak velocity 2.5 m/sec; mild to mod AS (mean 19, peak 33), mild AI, severe post MAC, mod LAE, PFO cannot be excluded  . Hypothyroidism   . Prostate cancer [facility])     Past Surgical History:  Procedure Laterality Date  . SEED IMPLANTS - PROSTATE      Current Outpatient Medications  Medication Sig Dispense Refill  . aspirin EC 81 MG tablet Take 81 mg by mouth every other day.    . bisoprolol (ZEBETA) 5 MG tablet Take 5 mg by mouth daily.    . Calcium Carbonate (CALCIUM 600 PO) Take by mouth daily.    . cholecalciferol (VITAMIN D) 400 units TABS tablet Take 400 Units by mouth daily.    Marland Kitchen ipratropium (ATROVENT) 0.06 % nasal spray USE 2  SPRAYS INTO EACH NOSTRIL TWICE A DAY AS NEEDED DRAINAGE  2  . levothyroxine (SYNTHROID, LEVOTHROID) 125 MCG tablet Take 125 mcg by mouth daily.     No current facility-administered medications for this visit.     No Known Allergies  Social History   Socioeconomic History  . Marital status: Widowed    Spouse name: Not on file  . Number of children: 4  . Years of education: Not on file  . Highest education level: Not on file  Occupational History  . Occupation: Retired  Scientific laboratory technician  . Financial resource strain: Not on file  . Food insecurity:    Worry: Not on file    Inability: Not on file  . Transportation needs:    Medical: Not on file    Non-medical: Not on file  Tobacco Use  . Smoking status: Former Smoker    Years: 20.00    Last attempt to quit: 07/19/1973    Years since quitting: 44.5  . Smokeless tobacco: Former Network engineer and Sexual Activity  . Alcohol use: Yes    Alcohol/week: 4.2 oz    Types: 7 Standard drinks or equivalent per week  . Drug use: No  . Sexual activity: Not on file  Lifestyle  . Physical activity:    Days per week: Not on file    Minutes per session: Not on file  . Stress: Not on  file  Relationships  . Social connections:    Talks on phone: Not on file    Gets together: Not on file    Attends religious service: Not on file    Active member of club or organization: Not on file    Attends meetings of clubs or organizations: Not on file    Relationship status: Not on file  . Intimate partner violence:    Fear of current or ex partner: Not on file    Emotionally abused: Not on file    Physically abused: Not on file    Forced sexual activity: Not on file  Other Topics Concern  . Not on file  Social History Narrative  . Not on file    Family History  Problem Relation Age of Onset  . Diabetes Mother     Review of Systems:  As stated in the HPI and otherwise negative.   BP 130/60   Pulse (!) 48   Ht 5' 10"  (1.778 m)   Wt 171  lb 6.4 oz (77.7 kg)   SpO2 97%   BMI 24.59 kg/m   Physical Examination:  General: Well developed, well nourished, NAD  HEENT: OP clear, mucus membranes moist  SKIN: warm, dry. No rashes. Neuro: No focal deficits  Musculoskeletal: Muscle strength 5/5 all ext  Psychiatric: Mood and affect normal  Neck: No JVD, no carotid bruits, no thyromegaly, no lymphadenopathy.  Lungs:Clear bilaterally, no wheezes, rhonci, crackles Cardiovascular: Regular rate and rhythm. Systolic murmur noted.   Abdomen:Soft. Bowel sounds present. Non-tender.  Extremities: No lower extremity edema. Pulses are 2 + in the bilateral DP/PT.  Echo May 2018: Left ventricle: SAM LVOT gradient not well characterized but peak   velocity in the 2.5 m/sec range. There was severe concentric   hypertrophy. Systolic function was normal. The estimated ejection   fraction was in the range of 50% to 55%. Doppler parameters are   consistent with abnormal left ventricular relaxation (grade 1   diastolic dysfunction). - Aortic valve: There was mild to moderate stenosis. There was mild   regurgitation. Valve area (VTI): 2.3 cm^2. Valve area (Vmax):   2.61 cm^2. Valve area (Vmean): 2.59 cm^2. - Mitral valve: Severe posterior annular calcification. Valve area   by pressure half-time: 2.5 cm^2. Valve area by continuity   equation (using LVOT flow): 3.73 cm^2. - Left atrium: The atrium was moderately dilated. - Atrial septum: A patent foramen ovale cannot be excluded.  EKG:  EKG is  ordered today. The ekg ordered today demonstrates Sinus brady, rate 48 bpm. 1st degree AV block. LBBB-chronic  Recent Labs: No results found for requested labs within last 8760 hours.   Lipid Panel No results found for: CHOL, TRIG, HDL, CHOLHDL, VLDL, LDLCALC, LDLDIRECT   Wt Readings from Last 3 Encounters:  01/10/18 171 lb 6.4 oz (77.7 kg)  12/28/16 170 lb (77.1 kg)  11/04/16 174 lb (78.9 kg)     Other studies Reviewed: Additional studies/  records that were reviewed today include: . Review of the above records demonstrates:   Assessment and Plan:   1. Hypertrophic Cardiomyopathy: Echo May 2018 with findings of HOCM, normal LV systolic function, unchanged. He has had previous workup by a HOCM specialist in [place]. He is feeling well overall. Will continue to follow. He does not want further invasive testing if ever indicated. Will continue beta blocker.     2. LBBB: Chronic  Current medicines are reviewed at length with the patient today.  The patient  does not have concerns regarding medicines.  The following changes have been made:  no change  Labs/ tests ordered today include:   Orders Placed This Encounter  Procedures  . EKG 12-Lead    Disposition:   FU with me in 12  months  Signed, Lauree Chandler, MD 01/10/2018 12:20 PM    [facility] [place], [place], [place]  [address] Phone: [phone]; Fax: [phone]

## 2018-03-18 DIAGNOSIS — N401 Enlarged prostate with lower urinary tract symptoms: Secondary | ICD-10-CM | POA: Diagnosis not present

## 2018-03-18 DIAGNOSIS — C61 Malignant neoplasm of prostate: Secondary | ICD-10-CM | POA: Diagnosis not present

## 2018-03-18 DIAGNOSIS — N3941 Urge incontinence: Secondary | ICD-10-CM | POA: Diagnosis not present

## 2018-03-30 DIAGNOSIS — H903 Sensorineural hearing loss, bilateral: Secondary | ICD-10-CM | POA: Diagnosis not present

## 2018-03-30 DIAGNOSIS — H6121 Impacted cerumen, right ear: Secondary | ICD-10-CM | POA: Diagnosis not present

## 2018-04-04 DIAGNOSIS — H90A31 Mixed conductive and sensorineural hearing loss, unilateral, right ear with restricted hearing on the contralateral side: Secondary | ICD-10-CM | POA: Diagnosis not present

## 2018-04-04 DIAGNOSIS — Z822 Family history of deafness and hearing loss: Secondary | ICD-10-CM | POA: Diagnosis not present

## 2018-04-04 DIAGNOSIS — H90A22 Sensorineural hearing loss, unilateral, left ear, with restricted hearing on the contralateral side: Secondary | ICD-10-CM | POA: Diagnosis not present

## 2018-04-04 DIAGNOSIS — Z570 Occupational exposure to noise: Secondary | ICD-10-CM | POA: Diagnosis not present

## 2018-08-06 ENCOUNTER — Emergency Department (HOSPITAL_BASED_OUTPATIENT_CLINIC_OR_DEPARTMENT_OTHER): Payer: PPO

## 2018-08-06 ENCOUNTER — Emergency Department (HOSPITAL_BASED_OUTPATIENT_CLINIC_OR_DEPARTMENT_OTHER)
Admission: EM | Admit: 2018-08-06 | Discharge: 2018-08-07 | Disposition: A | Discharge status: Home / Self Care | Payer: PPO | Attending: Emergency Medicine | Admitting: Emergency Medicine

## 2018-08-06 ENCOUNTER — Encounter (HOSPITAL_BASED_OUTPATIENT_CLINIC_OR_DEPARTMENT_OTHER): Payer: Self-pay

## 2018-08-06 ENCOUNTER — Other Ambulatory Visit: Payer: Self-pay

## 2018-08-06 DIAGNOSIS — W01198A Fall on same level from slipping, tripping and stumbling with subsequent striking against other object, initial encounter: Secondary | ICD-10-CM | POA: Diagnosis not present

## 2018-08-06 DIAGNOSIS — Y9289 Other specified places as the place of occurrence of the external cause: Secondary | ICD-10-CM | POA: Insufficient documentation

## 2018-08-06 DIAGNOSIS — S43016A Anterior dislocation of unspecified humerus, initial encounter: Secondary | ICD-10-CM

## 2018-08-06 DIAGNOSIS — Y9389 Activity, other specified: Secondary | ICD-10-CM | POA: Diagnosis not present

## 2018-08-06 DIAGNOSIS — S43015A Anterior dislocation of left humerus, initial encounter: Secondary | ICD-10-CM | POA: Diagnosis not present

## 2018-08-06 DIAGNOSIS — Y998 Other external cause status: Secondary | ICD-10-CM | POA: Diagnosis not present

## 2018-08-06 DIAGNOSIS — S4992XA Unspecified injury of left shoulder and upper arm, initial encounter: Secondary | ICD-10-CM | POA: Diagnosis present

## 2018-08-06 HISTORY — DX: Unspecified hearing loss, unspecified ear: H91.90

## 2018-08-06 MED ORDER — LORAZEPAM 2 MG/ML IJ SOLN
0.5000 mg | Freq: Once | INTRAMUSCULAR | Status: AC
  Administered 2018-08-06: 0.5 mg via INTRAVENOUS
  Filled 2018-08-06: qty 1

## 2018-08-06 MED ORDER — HYDROMORPHONE HCL 1 MG/ML IJ SOLN
0.5000 mg | Freq: Once | INTRAMUSCULAR | Status: AC
  Administered 2018-08-06: 0.5 mg via INTRAVENOUS
  Filled 2018-08-06: qty 1

## 2018-08-06 MED ORDER — SODIUM CHLORIDE 0.9 % IV BOLUS
500.0000 mL | Freq: Once | INTRAVENOUS | Status: AC
  Administered 2018-08-06: 500 mL via INTRAVENOUS

## 2018-08-06 NOTE — ED Provider Notes (Signed)
[facility] Provider Note  CSN: [id number] Arrival date & time: 08/06/18 2200  Chief Complaint(s) Fall  HPI Edward Foster is a 85 y.o. male who presents to the emergency department with left shoulder pain following a fall from squatting position that occurred 1 to 2 hours prior to arrival.  He reports that he was squatting down to reach into the dishwasher when he lost his balance and fell onto his left side.  Patient endorsing moderate left shoulder soreness and aching that is exacerbated with range of motion.  Alleviated by mobility.  Denies any other traumas including head trauma.  Currently denies any headache, neck pain, back pain, chest pain, abdominal pain, other extremity pain.  HPI  Past Medical History Past Medical History:  Diagnosis Date  . Heart murmur   . HOH (hard of hearing)   . Hypertrophic cardiomyopathy ([place])    Echo 5/18: severe conc LVH, EF 50-55, LVOT gradient not well characterized but peak velocity 2.5 m/sec; mild to mod AS (mean 19, peak 33), mild AI, severe post MAC, mod LAE, PFO cannot be excluded  . Hypothyroidism   . Prostate cancer [facility])    Patient Active Problem List   Diagnosis Date Noted  . Hypertrophic obstructive cardiomyopathy(425.11) 07/19/2012   Home Medication(s) Prior to Admission medications   Medication Sig Start Date End Date Taking? Authorizing Provider  acetaminophen (TYLENOL) 500 MG tablet Take 2 tablets (1,000 mg total) by mouth every 8 (eight) hours for 5 days. Do not take more than 4000 mg of acetaminophen (Tylenol) in a 24-hour period. Please note that other medicines that you may be prescribed may have Tylenol as well. 08/07/18 08/12/18  Fatima Blank, MD  aspirin EC 81 MG tablet Take 81 mg by mouth every other day.    [provider]  bisoprolol (ZEBETA) 5 MG tablet Take 5 mg by mouth daily.    [provider]  Calcium Carbonate (CALCIUM 600 PO) Take by mouth daily.    [provider]  cholecalciferol (VITAMIN D) 400 units TABS tablet Take 400 Units by mouth daily.    [provider]  HYDROcodone-acetaminophen (NORCO/VICODIN) 5-325 MG tablet Take 0.5-1 tablets by mouth every 8 (eight) hours as needed for up to 3 days for severe pain (That is not improved by your scheduled acetaminophen regimen). Please do not exceed 4000 mg of acetaminophen (Tylenol) a 24-hour period. Please note that he may be prescribed additional medicine that contains acetaminophen. 08/07/18 08/10/18  Fatima Blank, MD  ibuprofen (ADVIL,MOTRIN) 600 MG tablet Take 1 tablet (600 mg total) by mouth every 6 (six) hours as needed. 08/07/18   Fatima Blank, MD  ipratropium (ATROVENT) 0.06 % nasal spray USE 2 SPRAYS INTO EACH NOSTRIL TWICE A DAY AS NEEDED DRAINAGE 11/16/17   [provider]  levothyroxine (SYNTHROID, LEVOTHROID) 125 MCG tablet Take 125 mcg by mouth daily.    [provider]  Past Surgical History Past Surgical History:  Procedure Laterality Date  . SEED IMPLANTS - PROSTATE     Family History Family History  Problem Relation Age of Onset  . Diabetes Mother     Social History Social History   Tobacco Use  . Smoking status: Former Smoker    Years: 20.00    Last attempt to quit: 07/19/1973    Years since quitting: 45.0  . Smokeless tobacco: Former Network engineer Use Topics  . Alcohol use: Yes    Alcohol/week: 7.0 standard drinks    Types: 7 Standard drinks or equivalent per week  . Drug use: No   Allergies Patient has no known allergies.  Review of Systems Review of Systems All other systems are reviewed and are negative for acute change except as noted in the HPI  Physical Exam Vital Signs  I have reviewed the triage vital signs BP 108/61   Pulse 67   Temp 98.5 F (36.9 C)   Resp (!) 28   Ht  _0  (1.778 m)   Wt 77.1 kg   SpO2 99%   BMI 24.39 kg/m   Physical Exam Constitutional:      General: He is not in acute distress.    Appearance: He is well-developed. He is not diaphoretic.  HENT:     Head: Normocephalic.     Right Ear: External ear normal.     Left Ear: External ear normal.  Eyes:     General: No scleral icterus.       Right eye: No discharge.        Left eye: No discharge.     Conjunctiva/sclera: Conjunctivae normal.     Pupils: Pupils are equal, round, and reactive to light.  Neck:     Musculoskeletal: Normal range of motion and neck supple.  Cardiovascular:     Rate and Rhythm: Regular rhythm.     Pulses:          Radial pulses are 2+ on the right side and 2+ on the left side.       Dorsalis pedis pulses are 2+ on the right side and 2+ on the left side.     Heart sounds: Normal heart sounds. No murmur. No friction rub. No gallop.   Pulmonary:     Effort: Pulmonary effort is normal. No respiratory distress.     Breath sounds: Normal breath sounds. No stridor.  Abdominal:     General: There is no distension.     Palpations: Abdomen is soft.     Tenderness: There is no abdominal tenderness.  Musculoskeletal:     Left shoulder: He exhibits decreased range of motion and deformity. He exhibits no tenderness, normal pulse and normal strength.     Cervical back: He exhibits no bony tenderness.     Thoracic back: He exhibits no bony tenderness.     Lumbar back: He exhibits no bony tenderness.     Comments: Clavicle stable. Chest stable to AP/Lat compression. Pelvis stable to Lat compression. No obvious extremity deformity. No chest or abdominal wall contusion.  Skin:    General: Skin is warm.  Neurological:     Mental Status: He is alert and oriented to person, place, and time.     GCS: GCS eye subscore is 4. GCS verbal subscore is 5. GCS motor subscore is 6.     Comments: Moving all extremities      ED Results and Treatments Labs (all labs  ordered are  listed, but only abnormal results are displayed) Labs Reviewed - No data to display                                                                                                                       EKG  EKG Interpretation  Date/Time:    Ventricular Rate:    PR Interval:    QRS Duration:   QT Interval:    QTC Calculation:   R Axis:     Text Interpretation:        Radiology Dg Shoulder Left  Result Date: 08/07/2018 CLINICAL DATA:  Postreduction left shoulder dislocation. EXAM: LEFT SHOULDER - 2+ VIEW COMPARISON:  08/06/2018 FINDINGS: Left shoulder demonstrates anatomic position on single view consistent with successful reduction of previous anterior dislocation. Small osseous fragment inferior to the glenoid likely represents a Bankart lesion. Calcification over the lateral humeral head may represent ligamentous calcification or calcific tendinosis. Acromioclavicular and coracoclavicular spaces are maintained. IMPRESSION: Anatomic position of the left shoulder postreduction. Small osseous fragment inferior to the glenoid likely represents a Bankart lesion. Electronically Signed   By: Lucienne Capers M.D.   On: 08/07/2018 00:50   Dg Shoulder Left  Result Date: 08/06/2018 CLINICAL DATA:  Proximal left humerus pain after a fall tonight. EXAM: LEFT SHOULDER - 2+ VIEW COMPARISON:  None. FINDINGS: Anterior dislocation of the left shoulder. There is anterior inferior position of the humeral head with respect to the glenoid. Tiny osseous fragments inferior to the glenoid may represent Bankart chip fractures. Probable impaction of the lateral humeral head on to the inferior glenoid. Coracoclavicular and acromioclavicular spaces are maintained. Soft tissues are unremarkable. IMPRESSION: Anterior dislocation of the left shoulder. Possible Bankart fracture. Electronically Signed   By: Lucienne Capers M.D.   On: 08/06/2018 23:14   Pertinent labs & imaging results that were available during  my care of the patient were reviewed by me and considered in my medical decision making (see chart for details).  Medications Ordered in ED Medications  HYDROmorphone (DILAUDID) injection 0.5 mg (0.5 mg Intravenous Given 08/06/18 2337)  LORazepam (ATIVAN) injection 0.5 mg (0.5 mg Intravenous Given 08/06/18 2337)  sodium chloride 0.9 % bolus 500 mL (0 mLs Intravenous Stopped 08/07/18 0051)  Procedures .Ortho Injury Treatment Date/Time: 08/07/2018 12:09 AM Performed by: Fatima Blank, MD Authorized by: Fatima Blank, MD   Consent:    Consent obtained:  Verbal   Consent given by:  Patient   Risks discussed:  Recurrent dislocation, fracture, stiffness and restricted joint movement   Alternatives discussed:  Immobilization and alternative treatmentInjury location: shoulder Location details: left shoulder Injury type: dislocation Dislocation type: anterior Hill-Sachs deformity: Bankart. Chronicity: new Pre-procedure neurovascular assessment: neurovascularly intact  Anesthesia: Local anesthesia used: no  Patient sedated: NoManipulation performed: yes Reduction method: Modified Cunningham. Reduction successful: yes X-ray confirmed reduction: yes Immobilization: sling Post-procedure neurovascular assessment: post-procedure neurovascularly intact Patient tolerance: Patient tolerated the procedure well with no immediate complications     (including critical care time)  Medical Decision Making / ED Course I have reviewed the nursing notes for this encounter and the patient's prior records (if available in EHR or on provided paperwork).    Low mechanism fall from squatting position resulting in left shoulder dislocation.  Plain film confirmed anterior dislocation with Bankart deformity.  Patient provided with pain medicine.  Reduced using  technique above.  Reduction successful.  The patient appears reasonably screened and/or stabilized for discharge and I doubt any other medical condition or other [facility] requiring further screening, evaluation, or treatment in the ED at this time prior to discharge.  The patient is safe for discharge with strict return precautions.   Final Clinical Impression(s) / ED Diagnoses Final diagnoses:  Anterior shoulder dislocation    Disposition: Discharge  Condition: Good  I have discussed the results, Dx and Tx plan with the patient who expressed understanding and agree(s) with the plan. Discharge instructions discussed at great length. The patient was given strict return precautions who verbalized understanding of the instructions. No further questions at time of discharge.    ED Discharge Orders         Ordered    acetaminophen (TYLENOL) 500 MG tablet  Every 8 hours     08/07/18 0013    ibuprofen (ADVIL,MOTRIN) 600 MG tablet  Every 6 hours PRN     08/07/18 0013    HYDROcodone-acetaminophen (NORCO/VICODIN) 5-325 MG tablet  Every 8 hours PRN     08/07/18 0013           Follow Up: Dene Gentry, MD [address] [place] [id number] [phone]   in 5-7 days, For close follow up following shoulder reduction     This chart was dictated using voice recognition software.  Despite best efforts to proofread,  errors can occur which can change the documentation meaning.   Fatima Blank, MD 08/07/18 0130

## 2018-08-06 NOTE — ED Triage Notes (Signed)
Pt was reaching in dishwasher and fell due to loosing his balance. Pt c/o LUE injury. Pt denies hitting head or LOC.

## 2018-08-07 ENCOUNTER — Emergency Department (HOSPITAL_BASED_OUTPATIENT_CLINIC_OR_DEPARTMENT_OTHER): Payer: PPO

## 2018-08-07 DIAGNOSIS — S43015A Anterior dislocation of left humerus, initial encounter: Secondary | ICD-10-CM | POA: Diagnosis not present

## 2018-08-07 MED ORDER — IBUPROFEN 600 MG PO TABS: 600.0000 mg | ORAL_TABLET | Freq: Four times a day (QID) | ORAL | 0 refills | Status: DC | PRN

## 2018-08-07 MED ORDER — ACETAMINOPHEN 500 MG PO TABS: 1000.0000 mg | ORAL_TABLET | Freq: Three times a day (TID) | ORAL | 0 refills | Status: AC

## 2018-08-07 MED ORDER — HYDROCODONE-ACETAMINOPHEN 5-325 MG PO TABS: 0.5000 | ORAL_TABLET | Freq: Three times a day (TID) | ORAL | 0 refills | Status: AC | PRN

## 2018-08-07 NOTE — ED Notes (Signed)
Patient left at this time with all belongings. 

## 2018-08-08 ENCOUNTER — Ambulatory Visit: Payer: PPO | Admitting: Family Medicine

## 2018-08-08 ENCOUNTER — Encounter: Payer: Self-pay | Admitting: Family Medicine

## 2018-08-08 VITALS — BP 151/69 | HR 48 | Ht 70.0 in | Wt 170.0 lb

## 2018-08-08 DIAGNOSIS — M25512 Pain in left shoulder: Secondary | ICD-10-CM

## 2018-08-08 NOTE — Progress Notes (Signed)
PCP: Velna Hatchet, MD  Subjective:   HPI: Patient is a 85 y.o. male here for left shoulder dislocation. On 08/06/2018 patient suffered a dislocated shoulder.  He was bending over/kneeling and reaching into the back of his dishwasher and fell to the side.  This resulted in a dislocation.  He was seen at the emergency department where his shoulder was reduced.  X-rays obtained.  He has been using Tylenol and Motrin alternating for pain control and reports 0/10 pain today - was severe and sharp when injury occurred.  He has been in a sling.  He denies any radiating pain.  No numbness or tingling in the hand.  No skin changes.  He denies any history of shoulder injuries or previous dislocations..  Past Medical History:  Diagnosis Date  . Heart murmur   . HOH (hard of hearing)   . Hypertrophic cardiomyopathy ([place])    Echo 5/18: severe conc LVH, EF 50-55, LVOT gradient not well characterized but peak velocity 2.5 m/sec; mild to mod AS (mean 19, peak 33), mild AI, severe post MAC, mod LAE, PFO cannot be excluded  . Hypothyroidism   . Prostate cancer [facility])     Current Outpatient Medications on File Prior to Visit  Medication Sig Dispense Refill  . acetaminophen (TYLENOL) 500 MG tablet Take 2 tablets (1,000 mg total) by mouth every 8 (eight) hours for 5 days. Do not take more than 4000 mg of acetaminophen (Tylenol) in a 24-hour period. Please note that other medicines that you may be prescribed may have Tylenol as well. 30 tablet 0  . aspirin EC 81 MG tablet Take 81 mg by mouth every other day.    . bisoprolol (ZEBETA) 5 MG tablet Take 5 mg by mouth daily.    . Calcium Carbonate (CALCIUM 600 PO) Take by mouth daily.    . cholecalciferol (VITAMIN D) 400 units TABS tablet Take 400 Units by mouth daily.    Marland Kitchen HYDROcodone-acetaminophen (NORCO/VICODIN) 5-325 MG tablet Take 0.5-1 tablets by mouth every 8 (eight) hours as needed for up to 3 days for severe pain (That is not improved by your scheduled  acetaminophen regimen). Please do not exceed 4000 mg of acetaminophen (Tylenol) a 24-hour period. Please note that he may be prescribed additional medicine that contains acetaminophen. 9 tablet 0  . ibuprofen (ADVIL,MOTRIN) 600 MG tablet Take 1 tablet (600 mg total) by mouth every 6 (six) hours as needed. 30 tablet 0  . ipratropium (ATROVENT) 0.06 % nasal spray USE 2 SPRAYS INTO EACH NOSTRIL TWICE A DAY AS NEEDED DRAINAGE  2  . levothyroxine (SYNTHROID, LEVOTHROID) 125 MCG tablet Take 125 mcg by mouth daily.     No current facility-administered medications on file prior to visit.     Past Surgical History:  Procedure Laterality Date  . SEED IMPLANTS - PROSTATE      No Known Allergies  Social History   Socioeconomic History  . Marital status: Widowed    Spouse name: Not on file  . Number of children: 4  . Years of education: Not on file  . Highest education level: Not on file  Occupational History  . Occupation: Retired  Scientific laboratory technician  . Financial resource strain: Not on file  . Food insecurity:    Worry: Not on file    Inability: Not on file  . Transportation needs:    Medical: Not on file    Non-medical: Not on file  Tobacco Use  . Smoking status: Former Smoker  Years: 20.00    Last attempt to quit: 07/19/1973    Years since quitting: 45.0  . Smokeless tobacco: Former Network engineer and Sexual Activity  . Alcohol use: Yes    Alcohol/week: 7.0 standard drinks    Types: 7 Standard drinks or equivalent per week  . Drug use: No  . Sexual activity: Not on file  Lifestyle  . Physical activity:    Days per week: Not on file    Minutes per session: Not on file  . Stress: Not on file  Relationships  . Social connections:    Talks on phone: Not on file    Gets together: Not on file    Attends religious service: Not on file    Active member of club or organization: Not on file    Attends meetings of clubs or organizations: Not on file    Relationship status: Not on  file  . Intimate partner violence:    Fear of current or ex partner: Not on file    Emotionally abused: Not on file    Physically abused: Not on file    Forced sexual activity: Not on file  Other Topics Concern  . Not on file  Social History Narrative  . Not on file    Family History  Problem Relation Age of Onset  . Diabetes Mother     BP (!) 151/69   Pulse (!) 48   Ht 5' 10"  (1.778 m)   Wt 170 lb (77.1 kg)   BMI 24.39 kg/m   Review of Systems: See HPI above.     Objective:  Physical Exam:  Gen: awake, alert, NAD, comfortable in exam room Pulm: breathing unlabored  Left shoulder: No obvious deformity Mild tenderness over the lateral shoulder Range of motion not assessed today. NV intact distally  Sensation intact in the hand.  5/5 grip strength and intrinsic hand muscles  Right shoulder: No deformity Full range of motion N/V intact in distal upper extremity   Assessment & Plan:  1.  Left shoulder pain secondary to dislocation status post reduction in the emergency department.  Injury occurred on 08/06/2018.  Today his pain is well controlled.  No neurovascular compromise.  X-rays from the emergency department were independently reviewed today.  No significant bony abnormality although there appears to be a very small bony Bankart at the inferior glenoid. -He will continue in the sling until follow-up.  He may come out of the sling periodically to work on elbow extension as well as very gentle range of motion of the shoulder with pendulum swings. - Continue Tylenol and Motrin for pain - Ice - Also discussed some basic home exercises for quadriceps strengthening as he notes some concern of lower extremity weakness resulting in falls.. - Follow-up in 2 weeks.  Anticipate starting rehab at this time

## 2018-08-08 NOTE — Patient Instructions (Signed)
Wear the sling regularly for the next 2 weeks and follow up with me then. Ok to come out of this to do hanging arm circles and to stretch your elbow out twice a day. Icing if needed 15 minutes at a time 3-4 times a day. Tylenol, topical biofreeze or aspercreme as needed. We will likely start you in physical therapy at follow-up.

## 2018-08-10 ENCOUNTER — Encounter: Payer: Self-pay | Admitting: Family Medicine

## 2018-08-10 DIAGNOSIS — H353131 Nonexudative age-related macular degeneration, bilateral, early dry stage: Secondary | ICD-10-CM | POA: Diagnosis not present

## 2018-08-18 DIAGNOSIS — E781 Pure hyperglyceridemia: Secondary | ICD-10-CM | POA: Diagnosis not present

## 2018-08-18 DIAGNOSIS — M859 Disorder of bone density and structure, unspecified: Secondary | ICD-10-CM | POA: Diagnosis not present

## 2018-08-18 DIAGNOSIS — R82998 Other abnormal findings in urine: Secondary | ICD-10-CM | POA: Diagnosis not present

## 2018-08-18 DIAGNOSIS — I10 Essential (primary) hypertension: Secondary | ICD-10-CM | POA: Diagnosis not present

## 2018-08-18 DIAGNOSIS — E038 Other specified hypothyroidism: Secondary | ICD-10-CM | POA: Diagnosis not present

## 2018-08-22 ENCOUNTER — Ambulatory Visit: Payer: PPO | Admitting: Family Medicine

## 2018-08-22 ENCOUNTER — Encounter: Payer: Self-pay | Admitting: Family Medicine

## 2018-08-22 VITALS — BP 158/61 | HR 57 | Ht 70.0 in | Wt 170.0 lb

## 2018-08-22 DIAGNOSIS — M25512 Pain in left shoulder: Secondary | ICD-10-CM

## 2018-08-22 NOTE — Progress Notes (Signed)
PCP: Velna Hatchet, MD  Subjective:   HPI: Patient is a 85 y.o. male here for left shoulder dislocation.  2/3: On 08/06/2018 patient suffered a dislocated shoulder.  He was bending over/kneeling and reaching into the back of his dishwasher and fell to the side.  This resulted in a dislocation.  He was seen at the emergency department where his shoulder was reduced.  X-rays obtained.  He has been using Tylenol and Motrin alternating for pain control and reports 0/10 pain today - was severe and sharp when injury occurred.  He has been in a sling.  He denies any radiating pain.  No numbness or tingling in the hand.  No skin changes.  He denies any history of shoulder injuries or previous dislocations..  2/17: Patient reports he's doing well. Pain at worst 3/10, dull. Wearing sling and was taking tylenol, ibuprofen with benefit but not needing this now. Doing simple arm circle exercise. No skin changes, numbness.  Past Medical History:  Diagnosis Date  . Heart murmur   . HOH (hard of hearing)   . Hypertrophic cardiomyopathy ([place])    Echo 5/18: severe conc LVH, EF 50-55, LVOT gradient not well characterized but peak velocity 2.5 m/sec; mild to mod AS (mean 19, peak 33), mild AI, severe post MAC, mod LAE, PFO cannot be excluded  . Hypothyroidism   . Prostate cancer [facility])     Current Outpatient Medications on File Prior to Visit  Medication Sig Dispense Refill  . aspirin EC 81 MG tablet Take 81 mg by mouth every other day.    . bisoprolol (ZEBETA) 5 MG tablet Take 5 mg by mouth daily.    . Calcium Carbonate (CALCIUM 600 PO) Take by mouth daily.    . cholecalciferol (VITAMIN D) 400 units TABS tablet Take 400 Units by mouth daily.    Marland Kitchen ibuprofen (ADVIL,MOTRIN) 600 MG tablet Take 1 tablet (600 mg total) by mouth every 6 (six) hours as needed. 30 tablet 0  . ipratropium (ATROVENT) 0.06 % nasal spray USE 2 SPRAYS INTO EACH NOSTRIL TWICE A DAY AS NEEDED DRAINAGE  2  . levothyroxine (SYNTHROID,  LEVOTHROID) 125 MCG tablet Take 125 mcg by mouth daily.     No current facility-administered medications on file prior to visit.     Past Surgical History:  Procedure Laterality Date  . SEED IMPLANTS - PROSTATE      No Known Allergies  Social History   Socioeconomic History  . Marital status: Widowed    Spouse name: Not on file  . Number of children: 4  . Years of education: Not on file  . Highest education level: Not on file  Occupational History  . Occupation: Retired  Scientific laboratory technician  . Financial resource strain: Not on file  . Food insecurity:    Worry: Not on file    Inability: Not on file  . Transportation needs:    Medical: Not on file    Non-medical: Not on file  Tobacco Use  . Smoking status: Former Smoker    Years: 20.00    Last attempt to quit: 07/19/1973    Years since quitting: 45.1  . Smokeless tobacco: Former Network engineer and Sexual Activity  . Alcohol use: Yes    Alcohol/week: 7.0 standard drinks    Types: 7 Standard drinks or equivalent per week  . Drug use: No  . Sexual activity: Not on file  Lifestyle  . Physical activity:    Days per week: Not on  file    Minutes per session: Not on file  . Stress: Not on file  Relationships  . Social connections:    Talks on phone: Not on file    Gets together: Not on file    Attends religious service: Not on file    Active member of club or organization: Not on file    Attends meetings of clubs or organizations: Not on file    Relationship status: Not on file  . Intimate partner violence:    Fear of current or ex partner: Not on file    Emotionally abused: Not on file    Physically abused: Not on file    Forced sexual activity: Not on file  Other Topics Concern  . Not on file  Social History Narrative  . Not on file    Family History  Problem Relation Age of Onset  . Diabetes Mother     BP (!) 158/61   Pulse (!) 57   Ht _0  (1.778 m)   Wt 170 lb (77.1 kg)   BMI 24.39 kg/m   Review of  Systems: See HPI above.     Objective:  Physical Exam:  Gen: NAD, comfortable in exam room  Left shoulder: No swelling, ecchymoses.  No gross deformity. Mild TTP anterior, posterior shoulder joint. ROM limited to 20 degrees ER, full IR, abduction/flexion to 90 degrees.  FROM elbow, wrist, digits with 5/5 strength. Negative Hawkins, Neers. Negative Yergasons. Strength 5/5 with empty can and resisted internal rotation.  3/5 strength ER. NV intact distally.   Assessment & Plan:  1.  Left shoulder pain- 2/2 dislocation > 2 weeks ago.  Clinically improving.  Start physical therapy, discontinue sling.  Icing, tylenol, ibuprofen if needed.  F/u in 4 weeks.

## 2018-08-22 NOTE — Patient Instructions (Signed)
You're doing great! Start arm circles, swings, table slides 3 sets of 10 once or twice a day. Start physical therapy and do home exercises on days you don't go to therapy. Icing, tylenol, ibuprofen if needed. Stop using the sling. Follow up with me in 4 weeks.

## 2018-08-25 DIAGNOSIS — I129 Hypertensive chronic kidney disease with stage 1 through stage 4 chronic kidney disease, or unspecified chronic kidney disease: Secondary | ICD-10-CM | POA: Diagnosis not present

## 2018-08-25 DIAGNOSIS — Z6825 Body mass index (BMI) 25.0-25.9, adult: Secondary | ICD-10-CM | POA: Diagnosis not present

## 2018-08-25 DIAGNOSIS — Z0000 Encounter for general adult medical examination without abnormal findings: Secondary | ICD-10-CM | POA: Diagnosis not present

## 2018-08-25 DIAGNOSIS — M25512 Pain in left shoulder: Secondary | ICD-10-CM | POA: Diagnosis not present

## 2018-08-25 DIAGNOSIS — M859 Disorder of bone density and structure, unspecified: Secondary | ICD-10-CM | POA: Diagnosis not present

## 2018-08-25 DIAGNOSIS — R55 Syncope and collapse: Secondary | ICD-10-CM | POA: Diagnosis not present

## 2018-08-25 DIAGNOSIS — E038 Other specified hypothyroidism: Secondary | ICD-10-CM | POA: Diagnosis not present

## 2018-08-25 DIAGNOSIS — H3530 Unspecified macular degeneration: Secondary | ICD-10-CM | POA: Diagnosis not present

## 2018-08-25 DIAGNOSIS — R531 Weakness: Secondary | ICD-10-CM | POA: Diagnosis not present

## 2018-08-25 DIAGNOSIS — Z1331 Encounter for screening for depression: Secondary | ICD-10-CM | POA: Diagnosis not present

## 2018-08-25 DIAGNOSIS — R32 Unspecified urinary incontinence: Secondary | ICD-10-CM | POA: Diagnosis not present

## 2018-08-25 DIAGNOSIS — N183 Chronic kidney disease, stage 3 (moderate): Secondary | ICD-10-CM | POA: Diagnosis not present

## 2018-08-31 DIAGNOSIS — M25512 Pain in left shoulder: Secondary | ICD-10-CM | POA: Diagnosis not present

## 2018-09-06 DIAGNOSIS — M25512 Pain in left shoulder: Secondary | ICD-10-CM | POA: Diagnosis not present

## 2018-09-08 DIAGNOSIS — M25512 Pain in left shoulder: Secondary | ICD-10-CM | POA: Diagnosis not present

## 2018-09-13 DIAGNOSIS — M25512 Pain in left shoulder: Secondary | ICD-10-CM | POA: Diagnosis not present

## 2018-09-15 DIAGNOSIS — M25512 Pain in left shoulder: Secondary | ICD-10-CM | POA: Diagnosis not present

## 2018-09-19 ENCOUNTER — Encounter: Payer: Self-pay | Admitting: Family Medicine

## 2018-09-19 ENCOUNTER — Other Ambulatory Visit: Payer: Self-pay

## 2018-09-19 ENCOUNTER — Ambulatory Visit: Payer: PPO | Admitting: Family Medicine

## 2018-09-19 VITALS — BP 123/54 | HR 50 | Ht 70.0 in | Wt 170.0 lb

## 2018-09-19 DIAGNOSIS — M25512 Pain in left shoulder: Secondary | ICD-10-CM | POA: Diagnosis not present

## 2018-09-19 NOTE — Patient Instructions (Signed)
Continue the home exercises - focus on the theraband exercises 3 sets of 10 once a day for 6 more weeks. Add straight leg raises, knee extensions with light 2 or 5 pound ankle weight also 3 sets of 10 once a day. Follow up with me as needed - just call me if you're having any problems or concerns. You can stop physical therapy now.

## 2018-09-20 ENCOUNTER — Encounter: Payer: Self-pay | Admitting: Family Medicine

## 2018-09-20 NOTE — Progress Notes (Signed)
PCP: Velna Hatchet, MD  Subjective:   HPI: Patient is a 85 y.o. male here for left shoulder dislocation.  2/3: On 08/06/2018 patient suffered a dislocated shoulder.  He was bending over/kneeling and reaching into the back of his dishwasher and fell to the side.  This resulted in a dislocation.  He was seen at the emergency department where his shoulder was reduced.  X-rays obtained.  He has been using Tylenol and Motrin alternating for pain control and reports 0/10 pain today - was severe and sharp when injury occurred.  He has been in a sling.  He denies any radiating pain.  No numbness or tingling in the hand.  No skin changes.  He denies any history of shoulder injuries or previous dislocations..  2/17: Patient reports he's doing well. Pain at worst 3/10, dull. Wearing sling and was taking tylenol, ibuprofen with benefit but not needing this now. Doing simple arm circle exercise. No skin changes, numbness.  3/16: Patient reports he's doing very well. Pain level at most 2/10 felt in upper arm. Motion much improved with home exercises, physical therapy. He's concerned about weakness in lower extremities, specifically quads and afraid he's going to fall as a result. Some pain in quad area as well. No injury or recent illness. No skin changes, numbness. No back pain.  Past Medical History:  Diagnosis Date  . Heart murmur   . HOH (hard of hearing)   . Hypertrophic cardiomyopathy ([place])    Echo 5/18: severe conc LVH, EF 50-55, LVOT gradient not well characterized but peak velocity 2.5 m/sec; mild to mod AS (mean 19, peak 33), mild AI, severe post MAC, mod LAE, PFO cannot be excluded  . Hypothyroidism   . Prostate cancer [facility])     Current Outpatient Medications on File Prior to Visit  Medication Sig Dispense Refill  . aspirin EC 81 MG tablet Take 81 mg by mouth every other day.    . bisoprolol (ZEBETA) 5 MG tablet Take 5 mg by mouth daily.    . Calcium Carbonate (CALCIUM 600 PO)  Take by mouth daily.    . cholecalciferol (VITAMIN D) 400 units TABS tablet Take 400 Units by mouth daily.    Marland Kitchen ibuprofen (ADVIL,MOTRIN) 600 MG tablet Take 1 tablet (600 mg total) by mouth every 6 (six) hours as needed. 30 tablet 0  . ipratropium (ATROVENT) 0.06 % nasal spray USE 2 SPRAYS INTO EACH NOSTRIL TWICE A DAY AS NEEDED DRAINAGE  2  . levothyroxine (SYNTHROID, LEVOTHROID) 125 MCG tablet Take 125 mcg by mouth daily.     No current facility-administered medications on file prior to visit.     Past Surgical History:  Procedure Laterality Date  . SEED IMPLANTS - PROSTATE      No Known Allergies  Social History   Socioeconomic History  . Marital status: Widowed    Spouse name: Not on file  . Number of children: 4  . Years of education: Not on file  . Highest education level: Not on file  Occupational History  . Occupation: Retired  Scientific laboratory technician  . Financial resource strain: Not on file  . Food insecurity:    Worry: Not on file    Inability: Not on file  . Transportation needs:    Medical: Not on file    Non-medical: Not on file  Tobacco Use  . Smoking status: Former Smoker    Years: 20.00    Last attempt to quit: 07/19/1973    Years since  quitting: 45.2  . Smokeless tobacco: Former Network engineer and Sexual Activity  . Alcohol use: Yes    Alcohol/week: 7.0 standard drinks    Types: 7 Standard drinks or equivalent per week  . Drug use: No  . Sexual activity: Not on file  Lifestyle  . Physical activity:    Days per week: Not on file    Minutes per session: Not on file  . Stress: Not on file  Relationships  . Social connections:    Talks on phone: Not on file    Gets together: Not on file    Attends religious service: Not on file    Active member of club or organization: Not on file    Attends meetings of clubs or organizations: Not on file    Relationship status: Not on file  . Intimate partner violence:    Fear of current or ex partner: Not on file     Emotionally abused: Not on file    Physically abused: Not on file    Forced sexual activity: Not on file  Other Topics Concern  . Not on file  Social History Narrative  . Not on file    Family History  Problem Relation Age of Onset  . Diabetes Mother     BP (!) 123/54   Pulse (!) 50   Ht 5' 10"  (1.778 m)   Wt 170 lb (77.1 kg)   BMI 24.39 kg/m   Review of Systems: See HPI above.     Objective:  Physical Exam:  Gen: NAD, comfortable in exam room  Left shoulder: No swelling, ecchymoses.  No gross deformity. No TTP. ROM improved with 50 degrees ER, full IR.  Abduction and flexion to 150 degrees.  FROM elbow, wrist. Negative Hawkins, Neers. Negative Yergasons. Strength 5/5 with empty can and resisted internal/external rotation. Negative apprehension. NV intact distally.  Able to stand from chair with arms folded. Strength 5/5 knee extension and hip flexion without pain. No tenderness of quad musculature.   Assessment & Plan:  1.  Left shoulder pain - 2/2 dislocation 6 weeks ago.  Much improved.  Switch to home exercise program only now.  Tylenol, icing if needed.  F/u prn.  2. Bilateral leg weakness - excellent strength on exam and reassuring exam.  Able to stand in chair with arms folded.  Reassured.  Shown home exercises to do daily to maintain strength.

## 2018-12-22 ENCOUNTER — Ambulatory Visit: Payer: PPO | Admitting: Family Medicine

## 2018-12-22 ENCOUNTER — Encounter: Payer: Self-pay | Admitting: Family Medicine

## 2018-12-22 ENCOUNTER — Ambulatory Visit (HOSPITAL_BASED_OUTPATIENT_CLINIC_OR_DEPARTMENT_OTHER)
Admission: RE | Admit: 2018-12-22 | Discharge: 2018-12-22 | Disposition: A | Discharge status: Home / Self Care | Payer: PPO | Source: Ambulatory Visit | Attending: Family Medicine | Admitting: Family Medicine

## 2018-12-22 ENCOUNTER — Other Ambulatory Visit: Payer: Self-pay

## 2018-12-22 VITALS — BP 121/52 | HR 51 | Ht 70.0 in | Wt 170.0 lb

## 2018-12-22 DIAGNOSIS — M7061 Trochanteric bursitis, right hip: Secondary | ICD-10-CM | POA: Insufficient documentation

## 2018-12-22 DIAGNOSIS — M8580 Other specified disorders of bone density and structure, unspecified site: Secondary | ICD-10-CM | POA: Diagnosis not present

## 2018-12-22 DIAGNOSIS — M1611 Unilateral primary osteoarthritis, right hip: Secondary | ICD-10-CM | POA: Diagnosis not present

## 2018-12-22 DIAGNOSIS — M47816 Spondylosis without myelopathy or radiculopathy, lumbar region: Secondary | ICD-10-CM | POA: Diagnosis not present

## 2018-12-22 DIAGNOSIS — M25551 Pain in right hip: Secondary | ICD-10-CM | POA: Diagnosis not present

## 2018-12-22 DIAGNOSIS — M2170 Unequal limb length (acquired), unspecified site: Secondary | ICD-10-CM | POA: Diagnosis not present

## 2018-12-22 NOTE — Assessment & Plan Note (Signed)
Pain over the lateral hip seems to be bursitis.  Could be radicular in nature.  Likely associated with his leg length discrepancy -Provided samples of Vimovo and Pennsaid. -Counseled on home exercise therapy and supportive care. -X-ray -If no improvement will consider injection.

## 2018-12-22 NOTE — Patient Instructions (Signed)
Nice to meet you You can place ice on the outside of the hip  Please try the vimovo. You can take 1 pill twice a day as needed.  You can try rub on medicine. A peasize twice a day  I will call you with the exercises and what type of insert to try next  Please send me a message in MyChart with any questions or updates.  Please see me back in 4 weeks.   --Dr. Raeford Razor

## 2018-12-22 NOTE — Assessment & Plan Note (Signed)
Has about a centimeter and a half difference with the right leg being shorter. -Placed a three-quarter heel lift.  May need to send for orthosis measurement.  Could consider a custom orthotic in the right foot and a green sport insole on the left.

## 2018-12-22 NOTE — Progress Notes (Signed)
Edward Foster - 85 y.o. male MRN [id number]  Date of birth: December 23, 1922  SUBJECTIVE:  Including CC & ROS.  Chief Complaint  Patient presents with  . Leg Pain    right leg    Edward Foster is a 85 y.o. male that is presenting with right lateral leg and hip pain.  This is been ongoing for a few weeks.  The pain is intermittent in nature.  He has no pain when he is sitting still.  The pain is worse when he is rising from a seated position or walking.  The pain does radiate to the gluteal region.  He feels it down to the lateral knee but does not go past the knee.  Has not taken anything for the pain.  Denies any inciting event or trauma.  Has been exercising like he normally does.  Has felt better today since he did not exercise.  Denies any surgeries.  Pain can be sharp and stabbing.    Review of Systems  Constitutional: Negative for fever.  HENT: Negative for congestion.   Respiratory: Negative for cough.   Cardiovascular: Negative for chest pain.  Gastrointestinal: Negative for abdominal pain.  Musculoskeletal: Positive for arthralgias and gait problem.  Skin: Negative for color change.  Neurological: Negative for syncope.  Hematological: Negative for adenopathy.    HISTORY: Past Medical, Surgical, Social, and Family History Reviewed & Updated per EMR.   Pertinent Historical Findings include:  Past Medical History:  Diagnosis Date  . Heart murmur   . HOH (hard of hearing)   . Hypertrophic cardiomyopathy ([place])    Echo 5/18: severe conc LVH, EF 50-55, LVOT gradient not well characterized but peak velocity 2.5 m/sec; mild to mod AS (mean 19, peak 33), mild AI, severe post MAC, mod LAE, PFO cannot be excluded  . Hypothyroidism   . Prostate cancer [facility])     Past Surgical History:  Procedure Laterality Date  . SEED IMPLANTS - PROSTATE      No Known Allergies  Family History  Problem Relation Age of Onset  . Diabetes Mother      Social History   Socioeconomic History   . Marital status: Widowed    Spouse name: Not on file  . Number of children: 4  . Years of education: Not on file  . Highest education level: Not on file  Occupational History  . Occupation: Retired  Scientific laboratory technician  . Financial resource strain: Not on file  . Food insecurity    Worry: Not on file    Inability: Not on file  . Transportation needs    Medical: Not on file    Non-medical: Not on file  Tobacco Use  . Smoking status: Former Smoker    Years: 20.00    Quit date: 07/19/1973    Years since quitting: 45.4  . Smokeless tobacco: Former Network engineer and Sexual Activity  . Alcohol use: Yes    Alcohol/week: 7.0 standard drinks    Types: 7 Standard drinks or equivalent per week  . Drug use: No  . Sexual activity: Not on file  Lifestyle  . Physical activity    Days per week: Not on file    Minutes per session: Not on file  . Stress: Not on file  Relationships  . Social Herbalist on phone: Not on file    Gets together: Not on file    Attends religious service: Not on file    Active member of club  or organization: Not on file    Attends meetings of clubs or organizations: Not on file    Relationship status: Not on file  . Intimate partner violence    Fear of current or ex partner: Not on file    Emotionally abused: Not on file    Physically abused: Not on file    Forced sexual activity: Not on file  Other Topics Concern  . Not on file  Social History Narrative  . Not on file     PHYSICAL EXAM:  VS: BP (!) 121/52   Pulse (!) 51   Ht _0  (1.778 m)   Wt 170 lb (77.1 kg)   BMI 24.39 kg/m  Physical Exam Gen: NAD, alert, cooperative with exam, well-appearing ENT: normal lips, normal nasal mucosa,  Eye: normal EOM, normal conjunctiva and lids CV:  no edema, +2 pedal pulses   Resp: no accessory muscle use, non-labored,  Skin: no rashes, no areas of induration  Neuro: normal tone, normal sensation to touch Psych:  normal insight, alert and  oriented MSK:  Back/right hip: Tenderness to palpation of the greater trochanter. Pain with internal rotation. Normal strength resistance with hip flexion, knee flexion and extension. Negative straight leg raise bilaterally. Leg length discrepancy with the right leg being shorter by about a centimeter and a half than the left. Some weakness with hip abduction. Neurovascularly intact     ASSESSMENT & PLAN:   Greater trochanteric bursitis of right hip Pain over the lateral hip seems to be bursitis.  Could be radicular in nature.  Likely associated with his leg length discrepancy -Provided samples of Vimovo and Pennsaid. -Counseled on home exercise therapy and supportive care. -X-ray -If no improvement will consider injection.  Leg length discrepancy Has about a centimeter and a half difference with the right leg being shorter. -Placed a three-quarter heel lift.  May need to send for orthosis measurement.  Could consider a custom orthotic in the right foot and a green sport insole on the left.

## 2018-12-27 ENCOUNTER — Telehealth: Payer: Self-pay | Admitting: Family Medicine

## 2018-12-27 DIAGNOSIS — N183 Chronic kidney disease, stage 3 (moderate): Secondary | ICD-10-CM | POA: Diagnosis not present

## 2018-12-27 DIAGNOSIS — B999 Unspecified infectious disease: Secondary | ICD-10-CM | POA: Diagnosis not present

## 2018-12-27 DIAGNOSIS — I129 Hypertensive chronic kidney disease with stage 1 through stage 4 chronic kidney disease, or unspecified chronic kidney disease: Secondary | ICD-10-CM | POA: Diagnosis not present

## 2018-12-27 DIAGNOSIS — S81802A Unspecified open wound, left lower leg, initial encounter: Secondary | ICD-10-CM | POA: Diagnosis not present

## 2018-12-27 NOTE — Telephone Encounter (Signed)
Spoke with patient's daughter about his results. Will try lift in right side.

## 2019-01-13 DIAGNOSIS — S81809S Unspecified open wound, unspecified lower leg, sequela: Secondary | ICD-10-CM | POA: Diagnosis not present

## 2019-01-13 DIAGNOSIS — B999 Unspecified infectious disease: Secondary | ICD-10-CM | POA: Diagnosis not present

## 2019-01-19 ENCOUNTER — Encounter: Payer: Self-pay | Admitting: Family Medicine

## 2019-01-19 ENCOUNTER — Other Ambulatory Visit: Payer: Self-pay

## 2019-01-19 ENCOUNTER — Ambulatory Visit: Payer: PPO | Admitting: Family Medicine

## 2019-01-19 VITALS — BP 116/65 | HR 63 | Ht 70.0 in

## 2019-01-19 DIAGNOSIS — M21622 Bunionette of left foot: Secondary | ICD-10-CM | POA: Diagnosis not present

## 2019-01-19 DIAGNOSIS — M1611 Unilateral primary osteoarthritis, right hip: Secondary | ICD-10-CM | POA: Insufficient documentation

## 2019-01-19 DIAGNOSIS — M169 Osteoarthritis of hip, unspecified: Secondary | ICD-10-CM | POA: Insufficient documentation

## 2019-01-19 NOTE — Patient Instructions (Signed)
Good to see you Please use tylenol for pain  You can try ice  Please try the exercises and stay active   Please send me a message in MyChart with any questions or updates.  Please see me back in 4 weeks to check your foot.   --Dr. Raeford Razor

## 2019-01-19 NOTE — Progress Notes (Signed)
Edward Foster - 85 y.o. male MRN [id number]  Date of birth: 21-Nov-1922  SUBJECTIVE:  Including CC & ROS.  Chief Complaint  Patient presents with  . Follow-up    follow up for right leg/hip    Edward Foster is a 85 y.o. male that is presenting with left lateral foot pain, right hip pain.  His left lateral foot pain is occurring over the head of the fifth metatarsal.  He has a bunionette in this area.  He has been experiencing this since the change in his shoes.  He was able to obtain a heel lift for the discrepancy of the right and left leg.  He has ongoing right lateral hip pain, anterior hip pain and posterior gluteal pain.  This pain is been ongoing.  Is been mild in nature.  Is worse with certain movements.  He is not been able to exercise like he normally does.  He denies any radicular symptoms.  The pain is intermittent in nature.  He ranges the pain from mild to moderate.  It can be sharp and stabbing..  Independent review of the right hip x-ray from 6/18 shows avascular necrosis and severe degenerative changes of the right hip joint.   Review of Systems  Constitutional: Negative for fever.  HENT: Negative for congestion.   Respiratory: Negative for cough.   Cardiovascular: Negative for chest pain.  Gastrointestinal: Negative for abdominal pain.  Musculoskeletal: Positive for arthralgias.  Skin: Negative for color change.  Neurological: Negative for syncope.  Hematological: Negative for adenopathy.    HISTORY: Past Medical, Surgical, Social, and Family History Reviewed & Updated per EMR.   Pertinent Historical Findings include:  Past Medical History:  Diagnosis Date  . Heart murmur   . HOH (hard of hearing)   . Hypertrophic cardiomyopathy ([place])    Echo 5/18: severe conc LVH, EF 50-55, LVOT gradient not well characterized but peak velocity 2.5 m/sec; mild to mod AS (mean 19, peak 33), mild AI, severe post MAC, mod LAE, PFO cannot be excluded  . Hypothyroidism   . Prostate  cancer [facility])     Past Surgical History:  Procedure Laterality Date  . SEED IMPLANTS - PROSTATE      No Known Allergies  Family History  Problem Relation Age of Onset  . Diabetes Mother      Social History   Socioeconomic History  . Marital status: Widowed    Spouse name: Not on file  . Number of children: 4  . Years of education: Not on file  . Highest education level: Not on file  Occupational History  . Occupation: Retired  Scientific laboratory technician  . Financial resource strain: Not on file  . Food insecurity    Worry: Not on file    Inability: Not on file  . Transportation needs    Medical: Not on file    Non-medical: Not on file  Tobacco Use  . Smoking status: Former Smoker    Years: 20.00    Quit date: 07/19/1973    Years since quitting: 45.5  . Smokeless tobacco: Former Network engineer and Sexual Activity  . Alcohol use: Yes    Alcohol/week: 7.0 standard drinks    Types: 7 Standard drinks or equivalent per week  . Drug use: No  . Sexual activity: Not on file  Lifestyle  . Physical activity    Days per week: Not on file    Minutes per session: Not on file  . Stress: Not on file  Relationships  . Social Herbalist on phone: Not on file    Gets together: Not on file    Attends religious service: Not on file    Active member of club or organization: Not on file    Attends meetings of clubs or organizations: Not on file    Relationship status: Not on file  . Intimate partner violence    Fear of current or ex partner: Not on file    Emotionally abused: Not on file    Physically abused: Not on file    Forced sexual activity: Not on file  Other Topics Concern  . Not on file  Social History Narrative  . Not on file     PHYSICAL EXAM:  VS: BP 116/65   Pulse 63   Ht _0  (1.778 m)   BMI 24.39 kg/m  Physical Exam Gen: NAD, alert, cooperative with exam, well-appearing ENT: normal lips, normal nasal mucosa,  Eye: normal EOM, normal conjunctiva and  lids CV:  no edema, +2 pedal pulses   Resp: no accessory muscle use, non-labored,  Skin: no rashes, no areas of induration  Neuro: normal tone, normal sensation to touch Psych:  normal insight, alert and oriented MSK:  Right hip: Pain with IR Limited IR  Normal strength to resistance with hip flexion  No tenderness to palpation of the greater trochanter. Left foot: Bunionette appreciated. Normal range of motion. Tenderness palpation of the bunionette. Neurovascular intact     ASSESSMENT & PLAN:   Bunionette of left foot It this may be attributed to the lift that was placed on the right foot. -Placed in green sport insoles and post was placed under the fifth metatarsal head.  OA (osteoarthritis) of hip Has significant degenerative changes and loss of the femoral head likely related to avascular necrosis.  Pain is mild currently. -Could consider injections going forward. -Counseled on home exercise therapy and supportive care. -Discussed and counseled on hip replacement

## 2019-01-19 NOTE — Assessment & Plan Note (Signed)
It this may be attributed to the lift that was placed on the right foot. -Placed in green sport insoles and post was placed under the fifth metatarsal head.

## 2019-01-19 NOTE — Assessment & Plan Note (Signed)
Has significant degenerative changes and loss of the femoral head likely related to avascular necrosis.  Pain is mild currently. -Could consider injections going forward. -Counseled on home exercise therapy and supportive care. -Discussed and counseled on hip replacement

## 2019-02-06 ENCOUNTER — Telehealth: Payer: Self-pay

## 2019-02-06 NOTE — Telephone Encounter (Signed)

## 2019-02-07 ENCOUNTER — Ambulatory Visit: Payer: PPO | Admitting: Physician Assistant

## 2019-02-07 ENCOUNTER — Encounter: Payer: Self-pay | Admitting: Physician Assistant

## 2019-02-07 ENCOUNTER — Other Ambulatory Visit: Payer: Self-pay

## 2019-02-07 ENCOUNTER — Encounter (INDEPENDENT_AMBULATORY_CARE_PROVIDER_SITE_OTHER): Payer: Self-pay

## 2019-02-07 VITALS — BP 128/62 | HR 58 | Ht 70.0 in | Wt 164.8 lb

## 2019-02-07 DIAGNOSIS — R001 Bradycardia, unspecified: Secondary | ICD-10-CM

## 2019-02-07 DIAGNOSIS — I421 Obstructive hypertrophic cardiomyopathy: Secondary | ICD-10-CM | POA: Diagnosis not present

## 2019-02-07 DIAGNOSIS — I447 Left bundle-branch block, unspecified: Secondary | ICD-10-CM | POA: Diagnosis not present

## 2019-02-07 DIAGNOSIS — R55 Syncope and collapse: Secondary | ICD-10-CM | POA: Diagnosis not present

## 2019-02-07 MED ORDER — BISOPROLOL FUMARATE 5 MG PO TABS: 2.5000 mg | ORAL_TABLET | Freq: Every day | ORAL | 1 refills | Status: DC

## 2019-02-07 NOTE — Progress Notes (Signed)
Cardiology Office Note:    Date:  02/07/2019   ID:  Edward Foster, DOB September 19, 1922, MRN [id number]  PCP:  Velna Hatchet, MD  Cardiologist:  Lauree Chandler, MD  Electrophysiologist:  None   Referring MD: Velna Hatchet, MD   Chief Complaint  Patient presents with  . Follow-up    HOCM  . Loss of Consciousness    History of Present Illness:    Edward Foster is a 85 y.o. 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 Cory Roughen  Prostate cancer status post radiation therapy  Hypothyroidism  History of syncope in May 2018 in the setting of herbal supplement for his bladder (anticholinergic side effects)  LBBB  Mr. Sweigert was last seen by Dr. Angelena Form in July 2019.  He returns for follow-up.  He is here with his daughter.  In February, he had another syncopal episode.  He went into the kitchen to get something out of the dishwasher.  His last memory is bending down to look into the dishwasher.  He then awoke on the floor.  He dislocated his shoulder and went to the emergency room.  He had no further work-up.  He really does not drive anymore.  He has not had chest pain or shortness of breath.  He does exercises at home 3 times a week without difficulty.  He has not had recurrent syncope.  He has not had orthopnea or lower extremity swelling.  His  Prior CV studies:   The following studies were reviewed today:  Echocardiogram 11/16/2016 Peak LVOT velocity 2.5 m/s, severe concentric LVH, EF 27-74, grade 1 diastolic dysfunction, mild to moderate aortic stenosis (mean 19, peak 33), mild AI, moderate LAE  Holter monitor 11/2016 Sinus rhythm with first degree AV block Rare premature ventricular contractions with 1.5 second post PVC pauses Rare premature atrial contractions Baseline RBBB  Echo 6/14 Severe asymmetric septal hypertrophy, posterior wall thickness 9 mm, EF 50-55, dynamic  obstruction at rest and during Valsalva (peak 55 at rest and peak 102 with Valsalva), normal wall motion, grade 1 diastolic dysfunction, mild aortic stenosis (mean 17, peak 28), mildly dilated aortic root (39 mm), mild SAM, moderate MAC, mild MR, mild to moderate LAE, PASP 30    Past Medical History:  Diagnosis Date  . Heart murmur   . HOH (hard of hearing)   . Hypertrophic cardiomyopathy ([place])    Echo 5/18: severe conc LVH, EF 50-55, LVOT gradient not well characterized but peak velocity 2.5 m/sec; mild to mod AS (mean 19, peak 33), mild AI, severe post MAC, mod LAE, PFO cannot be excluded  . Hypothyroidism   . Prostate cancer [facility])    Surgical Hx: The patient  has a past surgical history that includes SEED IMPLANTS - PROSTATE.   Current Medications: Current Meds  Medication Sig  . bisoprolol (ZEBETA) 5 MG tablet Take 0.5 tablets (2.5 mg total) by mouth daily.  . Calcium Carbonate (CALCIUM 600 PO) Take by mouth daily.  . cholecalciferol (VITAMIN D) 400 units TABS tablet Take 400 Units by mouth daily.  Marland Kitchen ibuprofen (ADVIL,MOTRIN) 600 MG tablet Take 1 tablet (600 mg total) by mouth every 6 (six) hours as needed.  Marland Kitchen ipratropium (ATROVENT) 0.06 % nasal spray USE 2 SPRAYS INTO EACH NOSTRIL TWICE A DAY AS NEEDED DRAINAGE  . levothyroxine (SYNTHROID, LEVOTHROID) 125 MCG tablet Take 125 mcg by mouth daily.  . [DISCONTINUED] bisoprolol (ZEBETA) 5 MG tablet Take 5 mg by  mouth daily.     Allergies:   Patient has no known allergies.   Social History   Tobacco Use  . Smoking status: Former Smoker    Years: 20.00    Quit date: 07/19/1973    Years since quitting: 45.5  . Smokeless tobacco: Former Network engineer Use Topics  . Alcohol use: Yes    Alcohol/week: 7.0 standard drinks    Types: 7 Standard drinks or equivalent per week  . Drug use: No     Family Hx: The patient's family history includes Diabetes in his mother.  ROS:   Please see the history of present illness.    ROS All  other systems reviewed and are negative.   EKGs/Labs/Other Test Reviewed:    EKG:  EKG is  ordered today.  The ekg ordered today demonstrates sinus bradycardia, heart rate 54, first-degree AV block, PR 282, left bundle branch block, no change from prior tracings  Recent Labs: No results found for requested labs within last 8760 hours.   Recent Lipid Panel No results found for: CHOL, TRIG, HDL, CHOLHDL, LDLCALC, LDLDIRECT  Physical Exam:    VS:  BP 128/62 (BP Location: Left Arm, Patient Position: Sitting, Cuff Size: Normal)   Pulse (!) 58   Ht _0  (1.778 m)   Wt 164 lb 12.8 oz (74.8 kg)   SpO2 97% Comment: at rest  BMI 23.65 kg/m     Wt Readings from Last 3 Encounters:  02/07/19 164 lb 12.8 oz (74.8 kg)  12/22/18 170 lb (77.1 kg)  09/19/18 170 lb (77.1 kg)     Physical Exam  Constitutional: He is oriented to person, place, and time. He appears well-developed and well-nourished. No distress.  HENT:  Head: Normocephalic and atraumatic.  Eyes: No scleral icterus.  Neck: No JVD present. No thyromegaly present.  Cardiovascular: Normal rate and regular rhythm.  Murmur heard.  Holosystolic murmur is present at the upper right sternal border and lower left sternal border. Pulmonary/Chest: Effort normal and breath sounds normal. He has no rales.  Abdominal: Soft. There is no hepatomegaly.  Musculoskeletal:        General: Edema (trace bilateral ankle edema) present.  Lymphadenopathy:    He has no cervical adenopathy.  Neurological: He is alert and oriented to person, place, and time.  Skin: Skin is warm and dry.  Psychiatric: He has a normal mood and affect.    ASSESSMENT & PLAN:    1. Syncope, unspecified syncope type Unexplained.  This occurred 6 months ago.  He really had no warning.  He has no symptoms that sound consistent with vasovagal episode.  Question if this may have been related to bradycardia.  Decrease Bisoprolol to 2.5 mg once daily.  Obtain Zio monitor x  48 hours.  If he has significant pauses, we will need to DC his beta-blocker.    2. Bradycardia Decrease beta-blocker and obtain monitor as noted.   3. HOCM (hypertrophic obstructive cardiomyopathy) (HCC) LVOT gradient 2.5 m/s and mild to mod AS on Echocardiogram in 2018.  At this point, I do not think a repeat echocardiogram will change management.    4. LBBB (left bundle branch block) Chronic.     Dispo:  Return in about 6 months (around 08/10/2019) for Routine Follow Up with Dr. Angelena Form.   Medication Adjustments/Labs and Tests Ordered: Current medicines are reviewed at length with the patient today.  Concerns regarding medicines are outlined above.  Tests Ordered: Orders Placed This Encounter  Procedures  .  Holter monitor - 48 hour  . EKG 12-Lead   Medication Changes: Meds ordered this encounter  Medications  . bisoprolol (ZEBETA) 5 MG tablet    Sig: Take 0.5 tablets (2.5 mg total) by mouth daily.    Dispense:  45 tablet    Refill:  1    Signed, Richardson Dopp, PA-C  02/07/2019 4:40 PM    [facility] [place], [place], [place]  [address] Phone: [phone]; Fax: [phone]

## 2019-02-07 NOTE — Patient Instructions (Signed)
Medication Instructions:  Your physician has recommended you make the following change in your medication:  1. Decrease Bisoprolol one half tablet (2.5 mg ) daily. Sent in today to requested pharmacy.   If you need a refill on your cardiac medications before your next appointment, please call your pharmacy.   Lab work: -None If you have labs (blood work) drawn today and your tests are completely normal, you will receive your results only by: Marland Kitchen MyChart Message (if you have MyChart) OR . A paper copy in the mail If you have any lab test that is abnormal or we need to change your treatment, we will call you to review the results.  Testing/Procedures: Your physician has recommended that you wear a holter monitor. Holter monitors are medical devices that record the heart's electrical activity. Doctors most often use these monitors to diagnose arrhythmias. Arrhythmias are problems with the speed or rhythm of the heartbeat. The monitor is a small, portable device. You can wear one while you do your normal daily activities. This is usually used to diagnose what is causing palpitations/syncope (passing out).    Follow-Up: At [facility], you and your health needs are our priority.  As part of our continuing mission to provide you with exceptional heart care, we have created designated Provider Care Teams.  These Care Teams include your primary Cardiologist (physician) and Advanced Practice Providers (APPs -  Physician Assistants and Nurse Practitioners) who all work together to provide you with the care you need, when you need it. You will need a follow up appointment in 6 months.  Please call our office 2 months in advance to schedule this appointment.  You may see Lauree Chandler, MD or one of the following Advanced Practice Providers on your designated Care Team:   [place]burn, PA-C Melina Copa, PA-C . Ermalinda Barrios, PA-C  Any Other Special Instructions Will Be Listed Below (If  Applicable).

## 2019-02-10 ENCOUNTER — Telehealth: Payer: Self-pay | Admitting: Radiology

## 2019-02-10 DIAGNOSIS — N183 Chronic kidney disease, stage 3 (moderate): Secondary | ICD-10-CM | POA: Diagnosis not present

## 2019-02-10 DIAGNOSIS — I129 Hypertensive chronic kidney disease with stage 1 through stage 4 chronic kidney disease, or unspecified chronic kidney disease: Secondary | ICD-10-CM | POA: Diagnosis not present

## 2019-02-10 DIAGNOSIS — E039 Hypothyroidism, unspecified: Secondary | ICD-10-CM | POA: Diagnosis not present

## 2019-02-10 DIAGNOSIS — Z0289 Encounter for other administrative examinations: Secondary | ICD-10-CM | POA: Diagnosis not present

## 2019-02-10 DIAGNOSIS — Z7189 Other specified counseling: Secondary | ICD-10-CM | POA: Diagnosis not present

## 2019-02-10 NOTE — Telephone Encounter (Signed)
Enrolled patient for a 3 day Zio monitor to be mailed. Brief instructions were gone over with the patients daughter and she knows to expect the monitor to arrive in 3-4 days.

## 2019-02-16 ENCOUNTER — Ambulatory Visit: Payer: PPO | Admitting: Family Medicine

## 2019-02-17 ENCOUNTER — Other Ambulatory Visit (INDEPENDENT_AMBULATORY_CARE_PROVIDER_SITE_OTHER): Payer: PPO

## 2019-02-17 DIAGNOSIS — I421 Obstructive hypertrophic cardiomyopathy: Secondary | ICD-10-CM

## 2019-02-23 ENCOUNTER — Ambulatory Visit: Payer: PPO | Admitting: Family Medicine

## 2019-03-01 DIAGNOSIS — I421 Obstructive hypertrophic cardiomyopathy: Secondary | ICD-10-CM | POA: Diagnosis not present

## 2019-03-01 DIAGNOSIS — R55 Syncope and collapse: Secondary | ICD-10-CM | POA: Diagnosis not present

## 2019-03-03 ENCOUNTER — Telehealth: Payer: Self-pay | Admitting: *Deleted

## 2019-03-03 ENCOUNTER — Telehealth: Payer: Self-pay

## 2019-03-03 NOTE — Telephone Encounter (Signed)
-----   Message from Liliane Shi, [place] sent at 03/03/2019 12:52 PM EDT ----- Please call the patient The monitor shows sinus rhythm.  There were very brief (up to 6 beats) fast heartbeats ("Supraventricular Tachycardia") but no sustained arrhythmias that would account for his syncope.   PLAN:   -Continue current medications and follow up as planned.  Richardson Dopp, PA-C    03/03/2019 12:45 PM

## 2019-03-03 NOTE — Telephone Encounter (Signed)
Tried to return the daughter, Mary's call, DPR on file.  No answer / no machine. Will route back to triage for Monday.

## 2019-03-03 NOTE — Telephone Encounter (Signed)
Daughter called back returning a call from our office. She will check the patient's MyChart to see if the results are online. Please call to discuss the results with the daughter

## 2019-03-03 NOTE — Telephone Encounter (Signed)
Notes recorded by Frederik Schmidt, RN on 03/03/2019 at 2:32 PM EDT  The patient has been notified of the result and verbalized understanding. All questions (if any) were answered.  Frederik Schmidt, RN 03/03/2019 2:32 PM   ------

## 2019-03-03 NOTE — Telephone Encounter (Signed)
-----   Message from Burnell Blanks, MD sent at 03/03/2019  9:38 AM EDT ----- Can we let him know that his monitor did not show any long pauses or reasons for syncope. He had quick bursts of SVT but that should not make him dizzy as it only lasted for a few seconds. Thanks, chris

## 2019-03-03 NOTE — Telephone Encounter (Signed)
LMTCB

## 2019-03-03 NOTE — Telephone Encounter (Signed)
Spoke with patient's daughter [facility]) and informed of results. All questions/concerns were answered/discussed. Aware next appointment is due in February per last ov note. She will relay all information to the patient.

## 2019-03-06 NOTE — Telephone Encounter (Signed)
Attempted to call the patient's daughter (ok per DPR). No answer- I left a message to please call back.

## 2019-03-07 NOTE — Telephone Encounter (Signed)
I spoke to the patient's daughter and results/questions have been discussed.  She was thankful for the call.

## 2019-03-09 ENCOUNTER — Other Ambulatory Visit: Payer: Self-pay

## 2019-03-09 ENCOUNTER — Encounter: Payer: Self-pay | Admitting: Family Medicine

## 2019-03-09 ENCOUNTER — Ambulatory Visit: Payer: PPO | Admitting: Family Medicine

## 2019-03-09 ENCOUNTER — Ambulatory Visit: Payer: Self-pay

## 2019-03-09 VITALS — BP 124/74 | Ht 70.0 in | Wt 164.0 lb

## 2019-03-09 DIAGNOSIS — M1611 Unilateral primary osteoarthritis, right hip: Secondary | ICD-10-CM | POA: Diagnosis not present

## 2019-03-09 MED ORDER — TRIAMCINOLONE ACETONIDE 40 MG/ML IJ SUSP
40.0000 mg | Freq: Once | INTRAMUSCULAR | Status: AC
  Administered 2019-03-09: 40 mg via INTRA_ARTICULAR

## 2019-03-09 NOTE — Progress Notes (Signed)
Edward Foster - 85 y.o. male MRN [id number]  Date of birth: Nov 06, 1922  SUBJECTIVE:  Including CC & ROS.  Chief Complaint  Patient presents with  . Follow-up    follow up for left foot    Edward Foster is a 85 y.o. male that is presenting with ongoing right hip pain.  He was diagnosed with avascular necrosis.  He had a significant leg length discrepancy on the right compared to the left.  Since that time he is having trouble getting up out of a seated chair.  He is having pain intermittently over the back and hips.  He has been doing the home exercises but they become harder as his pain increases.  He is taken Tylenol on a regular basis.  He denies any subsequent injury or inciting event..    Review of Systems  Constitutional: Negative for fever.  HENT: Negative for congestion.   Respiratory: Negative for cough.   Cardiovascular: Negative for chest pain.  Gastrointestinal: Negative for abdominal pain.  Musculoskeletal: Positive for arthralgias and gait problem.  Skin: Negative for color change.  Neurological: Negative for weakness.  Hematological: Negative for adenopathy.    HISTORY: Past Medical, Surgical, Social, and Family History Reviewed & Updated per EMR.   Pertinent Historical Findings include:  Past Medical History:  Diagnosis Date  . Heart murmur   . HOH (hard of hearing)   . Hypertrophic cardiomyopathy ([place])    Echo 5/18: severe conc LVH, EF 50-55, LVOT gradient not well characterized but peak velocity 2.5 m/sec; mild to mod AS (mean 19, peak 33), mild AI, severe post MAC, mod LAE, PFO cannot be excluded  . Hypothyroidism   . Prostate cancer [facility])     Past Surgical History:  Procedure Laterality Date  . SEED IMPLANTS - PROSTATE      No Known Allergies  Family History  Problem Relation Age of Onset  . Diabetes Mother      Social History   Socioeconomic History  . Marital status: Widowed    Spouse name: Not on file  . Number of children: 4  . Years  of education: Not on file  . Highest education level: Not on file  Occupational History  . Occupation: Retired  Scientific laboratory technician  . Financial resource strain: Not on file  . Food insecurity    Worry: Not on file    Inability: Not on file  . Transportation needs    Medical: Not on file    Non-medical: Not on file  Tobacco Use  . Smoking status: Former Smoker    Years: 20.00    Quit date: 07/19/1973    Years since quitting: 45.6  . Smokeless tobacco: Former Network engineer and Sexual Activity  . Alcohol use: Yes    Alcohol/week: 7.0 standard drinks    Types: 7 Standard drinks or equivalent per week  . Drug use: No  . Sexual activity: Not on file  Lifestyle  . Physical activity    Days per week: Not on file    Minutes per session: Not on file  . Stress: Not on file  Relationships  . Social Herbalist on phone: Not on file    Gets together: Not on file    Attends religious service: Not on file    Active member of club or organization: Not on file    Attends meetings of clubs or organizations: Not on file    Relationship status: Not on file  . Intimate  partner violence    Fear of current or ex partner: Not on file    Emotionally abused: Not on file    Physically abused: Not on file    Forced sexual activity: Not on file  Other Topics Concern  . Not on file  Social History Narrative  . Not on file     PHYSICAL EXAM:  VS: BP 124/74   Ht _0  (1.778 m)   Wt 164 lb (74.4 kg)   BMI 23.53 kg/m  Physical Exam Gen: NAD, alert, cooperative with exam, well-appearing ENT: normal lips, normal nasal mucosa,  Eye: normal EOM, normal conjunctiva and lids CV:  no edema, +2 pedal pulses   Resp: no accessory muscle use, non-labored,   Skin: no rashes, no areas of induration  Neuro: normal tone, normal sensation to touch Psych:  normal insight, alert and oriented MSK:  Right hip: Limited internal rotation. Normal strength resistance with hip flexion. Weakness with  hip abduction. Significant leg length discrepancy. Maintains a fairly neutral gait with his corrected shoes. Neurovascularly intact   Aspiration/Injection Procedure Note Edward Foster 22-Aug-1922  Procedure: Injection Indications: Right hip pain  Procedure Details Consent: Risks of procedure as well as the alternatives and risks of each were explained to the (patient/caregiver).  Consent for procedure obtained. Time Out: Verified patient identification, verified procedure, site/side was marked, verified correct patient position, special equipment/implants available, medications/allergies/relevent history reviewed, required imaging and test results available.  Performed.  The area was cleaned with iodine and alcohol swabs.    The right hip joint was injected using 1 cc's of 40 mg Kenalog and 4 cc's of 0.25% bupivacaine with a 22 3 1/2" needle.  Ultrasound was used. Images were obtained in long views showing the injection.     A sterile dressing was applied.  Patient did tolerate procedure well.       ASSESSMENT & PLAN:   OA (osteoarthritis) of hip Pain is likely related to the abnormal positioning and movement of the hip joint.  Had an effusion on ultrasound. -Hip joint injection. -Counseled on home exercise therapy and supportive care. -Could consider Prolia.  He will consult with his primary if he has had a bone scan in the past. -Discussed the possibility of hip joint replacement.  His cardiology suggested a spinal block.

## 2019-03-09 NOTE — Assessment & Plan Note (Signed)
Pain is likely related to the abnormal positioning and movement of the hip joint.  Had an effusion on ultrasound. -Hip joint injection. -Counseled on home exercise therapy and supportive care. -Could consider Prolia.  He will consult with his primary if he has had a bone scan in the past. -Discussed the possibility of hip joint replacement.  His cardiology suggested a spinal block.

## 2019-03-09 NOTE — Patient Instructions (Signed)
Good to see you Please try ice over the area  Good job with the exercises and keep doing them.  Please continue the tylenol  Please ask about the bone scan and we can do consider looking into prolia for bone strengthening   Please send me a message in MyChart with any questions or updates.  Please see me back in 4 weeks.   --Dr. Raeford Razor

## 2019-03-21 DIAGNOSIS — N3941 Urge incontinence: Secondary | ICD-10-CM | POA: Diagnosis not present

## 2019-04-06 ENCOUNTER — Encounter: Payer: Self-pay | Admitting: Family Medicine

## 2019-04-06 ENCOUNTER — Ambulatory Visit: Payer: PPO | Admitting: Family Medicine

## 2019-04-06 ENCOUNTER — Other Ambulatory Visit: Payer: Self-pay

## 2019-04-06 DIAGNOSIS — M1611 Unilateral primary osteoarthritis, right hip: Secondary | ICD-10-CM | POA: Diagnosis not present

## 2019-04-06 NOTE — Assessment & Plan Note (Signed)
Limited improvement with conservative measures thus far.  I think he may benefit from surgery and will send him to have a discussion with a surgeon. -Referral to [place] on supportive care. -Can follow-up as needed.

## 2019-04-06 NOTE — Progress Notes (Signed)
Edward Foster - 85 y.o. male MRN [id number]  Date of birth: [birth year]  SUBJECTIVE:  Including CC & ROS.  Chief Complaint  Patient presents with  . Follow-up    follow up for right hip and left foot    Edward Foster is a 85 y.o. male that is following up for his ongoing right hip pain. He received a hip injection and didn't notice much improvement in his symptoms. He has been performing the home exercises. Has trouble rising from a seated position. Has pain down the anterior aspect of the thigh, on the lateral portion as well as in the gluteus.  Has trouble walking long distances.  Uses a cane to help with ambulation.   Review of Systems  Constitutional: Negative for fever.  HENT: Negative for congestion.   Respiratory: Negative for cough.   Cardiovascular: Negative for chest pain.  Gastrointestinal: Negative for abdominal pain.  Musculoskeletal: Positive for arthralgias and gait problem.  Skin: Negative for color change.  Neurological: Negative for weakness.  Hematological: Negative for adenopathy.    HISTORY: Past Medical, Surgical, Social, and Family History Reviewed & Updated per EMR.   Pertinent Historical Findings include:  Past Medical History:  Diagnosis Date  . Heart murmur   . HOH (hard of hearing)   . Hypertrophic cardiomyopathy ([place])    Echo 5/18: severe conc LVH, EF 50-55, LVOT gradient not well characterized but peak velocity 2.5 m/sec; mild to mod AS (mean 19, peak 33), mild AI, severe post MAC, mod LAE, PFO cannot be excluded  . Hypothyroidism   . Prostate cancer [facility])     Past Surgical History:  Procedure Laterality Date  . SEED IMPLANTS - PROSTATE      No Known Allergies  Family History  Problem Relation Age of Onset  . Diabetes Mother      Social History   Socioeconomic History  . Marital status: Widowed    Spouse name: Not on file  . Number of children: 4  . Years of education: Not on file  . Highest education level: Not on file   Occupational History  . Occupation: Retired  Scientific laboratory technician  . Financial resource strain: Not on file  . Food insecurity    Worry: Not on file    Inability: Not on file  . Transportation needs    Medical: Not on file    Non-medical: Not on file  Tobacco Use  . Smoking status: Former Smoker    Years: 20.00    Quit date: 07/19/1973    Years since quitting: 45.7  . Smokeless tobacco: Former Network engineer and Sexual Activity  . Alcohol use: Yes    Alcohol/week: 7.0 standard drinks    Types: 7 Standard drinks or equivalent per week  . Drug use: No  . Sexual activity: Not on file  Lifestyle  . Physical activity    Days per week: Not on file    Minutes per session: Not on file  . Stress: Not on file  Relationships  . Social Herbalist on phone: Not on file    Gets together: Not on file    Attends religious service: Not on file    Active member of club or organization: Not on file    Attends meetings of clubs or organizations: Not on file    Relationship status: Not on file  . Intimate partner violence    Fear of current or ex partner: Not on file    Emotionally  abused: Not on file    Physically abused: Not on file    Forced sexual activity: Not on file  Other Topics Concern  . Not on file  Social History Narrative  . Not on file     PHYSICAL EXAM:  VS: BP (!) 146/66   Pulse (!) 51   Ht _0  (1.778 m)   Wt 170 lb (77.1 kg)   BMI 24.39 kg/m  Physical Exam Gen: NAD, alert, cooperative with exam, well-appearing ENT: normal lips, normal nasal mucosa,  Eye: normal EOM, normal conjunctiva and lids CV:  no edema, +2 pedal pulses   Resp: no accessory muscle use, non-labored,   Skin: no rashes, no areas of induration  Neuro: normal tone, normal sensation to touch Psych:  normal insight, alert and oriented MSK:  Right hip: Limited internal rotation. Tenderness to palpation of the greater trochanter. Normal strength resistance with hip flexion, Using cane  to help with ambulation. Neurovascular intact     ASSESSMENT & PLAN:   OA (osteoarthritis) of hip Limited improvement with conservative measures thus far.  I think he may benefit from surgery and will send him to have a discussion with a surgeon. -Referral to [place] on supportive care. -Can follow-up as needed.

## 2019-04-18 DIAGNOSIS — M1611 Unilateral primary osteoarthritis, right hip: Secondary | ICD-10-CM | POA: Diagnosis not present

## 2019-04-20 ENCOUNTER — Other Ambulatory Visit: Payer: Self-pay | Admitting: Orthopedic Surgery

## 2019-05-04 ENCOUNTER — Telehealth: Payer: Self-pay | Admitting: Cardiovascular Disease

## 2019-05-04 NOTE — Telephone Encounter (Signed)
   Primary Cardiologist:Christopher Angelena Form, MD  Chart reviewed as part of pre-operative protocol coverage. Because of Camrynn Macari past medical history and time since last visit, he/she will require a follow-up visit in order to better assess preoperative cardiovascular risk.  Pre-op covering staff: - Please schedule appointment and call patient to inform them. - Please contact requesting surgeon's office via preferred method (i.e, phone, fax) to inform them of need for appointment prior to surgery.  If applicable, this message will also be routed to pharmacy pool and/or primary cardiologist for input on holding anticoagulant/antiplatelet agent as requested below so that this information is available at time of patient's appointment.   [place], [place]  05/04/2019, 3:52 PM

## 2019-05-04 NOTE — Telephone Encounter (Signed)
New Message  Faxed notes on 04/26/19        [facility] Pre-operative Risk Assessment    Request for surgical clearance:  1. What type of surgery is being performed? Right Total Hip Arthroplasty   2. When is this surgery scheduled? 05/29/19   3. What type of clearance is required (medical clearance vs. Pharmacy clearance to hold med vs. Both)? Both  4. Are there any medications that need to be held prior to surgery and how long? Unknown   5. Practice name and name of physician performing surgery? [facility] Dr Frederik Pear   6. What is your office phone number [phone]    7.   What is your office fax number [phone]   8.   Anesthesia type (None, local, MAC, general) ? Spinal Anesthesia    Marca Ancona 05/04/2019, 11:22 AM  _________________________________________________________________   (provider comments below)

## 2019-05-05 NOTE — Telephone Encounter (Signed)
Left message for the patient to give our office a call to get scheduled for a PreOp clearance appointment. Will route to [employer].

## 2019-05-08 ENCOUNTER — Ambulatory Visit: Payer: PPO | Admitting: Cardiovascular Disease

## 2019-05-08 NOTE — Telephone Encounter (Signed)
Today's appt with Dr. Angelena Form was cancelled. Per callback staff, daughter did not think the appt today with Dr. Angelena Form was needed. I called and spoke with Mr Bester daughter, she says the surgery was scheduled after her father's appt with Richardson Dopp in August. She were under the impression that no further workup is needed and the surgical risk was tolerable on the spinal anesthesia per discussion. However I did not see it was not documented in the previous note. Last echo was in 2018 for HOCM and aortic stenosis, no repeat echo since. His daughter is open to the idea of reassessment if this is felt to be needed.   I will message Richardson Dopp PA-C to check his opinion. His age (85 yo), HOCM and aortic stenosis likely increases his overall surgical risk. He would be at least a moderate to high risk patient for total hip surgery although spinal anesthesia may be lower risk than general anesthesia.

## 2019-05-08 NOTE — Telephone Encounter (Signed)
Follow up   Contacted the patient's daughter on the dpr per the previous message she states that the appt with Richardson Dopp back in August should be suitable for the preop appt. I spoke with Vicenta Dunning and she states that she will get in contact with Almyra Deforest on today and have him to contact the patient's daughter.

## 2019-05-09 NOTE — Telephone Encounter (Signed)
I think we can clear with a phone call if he is clinically stable. Gerald Stabs

## 2019-05-09 NOTE — Telephone Encounter (Signed)
With spinal anesthesia, his risk should be lower.  But, given his age and hx of HOCM, I prefer Dr. Angelena Form weigh in on whether or not he can be cleared with a phone call to assess for clinical changes since Aug 2020 (instead of an in-person visit).   Richardson Dopp, PA-C    05/09/2019 8:32 AM

## 2019-05-09 NOTE — Telephone Encounter (Signed)
   Primary Cardiologist: Lauree Chandler, MD  Chart reviewed as part of pre-operative protocol coverage. Given past medical history and time since last visit, based on ACC/AHA guidelines, Edward Foster would be at acceptable risk for the planned procedure without further cardiovascular testing.   Spoke with patient and daughter 05/09/2019 and patient is without clinical changes since last seen 02/07/2019.   I will route this recommendation to the requesting party via Epic fax function and remove from pre-op pool.  Please call with questions.  Kathyrn Drown, NP 05/09/2019, 2:10 PM

## 2019-05-11 DIAGNOSIS — N1831 Chronic kidney disease, stage 3a: Secondary | ICD-10-CM | POA: Diagnosis not present

## 2019-05-11 DIAGNOSIS — I129 Hypertensive chronic kidney disease with stage 1 through stage 4 chronic kidney disease, or unspecified chronic kidney disease: Secondary | ICD-10-CM | POA: Diagnosis not present

## 2019-05-11 DIAGNOSIS — I421 Obstructive hypertrophic cardiomyopathy: Secondary | ICD-10-CM | POA: Diagnosis not present

## 2019-05-11 DIAGNOSIS — R55 Syncope and collapse: Secondary | ICD-10-CM | POA: Diagnosis not present

## 2019-05-11 DIAGNOSIS — Z01818 Encounter for other preprocedural examination: Secondary | ICD-10-CM | POA: Diagnosis not present

## 2019-05-11 DIAGNOSIS — I447 Left bundle-branch block, unspecified: Secondary | ICD-10-CM | POA: Diagnosis not present

## 2019-05-11 DIAGNOSIS — M25551 Pain in right hip: Secondary | ICD-10-CM | POA: Diagnosis not present

## 2019-05-24 NOTE — Patient Instructions (Addendum)
DUE TO COVID-19 ONLY ONE VISITOR IS ALLOWED TO COME WITH YOU AND STAY IN THE WAITING ROOM ONLY DURING PRE OP AND PROCEDURE DAY OF SURGERY. THE 1 VISITOR MAY VISIT WITH YOU AFTER SURGERY IN YOUR PRIVATE ROOM DURING VISITING HOURS ONLY!  YOU NEED TO HAVE A COVID 19 TEST ON 05/25/2019 @ 2:15 PM. THIS TEST MUST BE DONE BEFORE SURGERY, COME  Edward Foster, [place] , [address].  (Edward Foster) ONCE YOUR COVID TEST IS COMPLETED, PLEASE BEGIN THE QUARANTINE INSTRUCTIONS AS OUTLINED IN YOUR HANDOUT.                Edward Foster    Your procedure is scheduled on: 05/29/2019   Report to [facility] Main  Entrance    Report to short stay at 05;30 AM     Call this number if you have problems the morning of surgery [phone]   NO SOLID FOOD AFTER MIDNIGHT THE NIGHT PRIOR TO SURGERY. NOTHING BY MOUTH EXCEPT CLEAR LIQUIDS UNTIL 0430 AM . PLEASE FINISH ENSURE DRINK PER SURGEON ORDER  WHICH NEEDS TO BE COMPLETED AT 0430 AM .    CLEAR LIQUID DIET   Foods Allowed                                                                     Foods Excluded  Coffee and tea, regular and decaf                             liquids that you cannot  Plain Jell-O any favor except red or purple                                           see through such as: Fruit ices (not with fruit pulp)                                     milk, soups, orange juice  Iced Popsicles                                    All solid food Carbonated beverages, regular and diet                                    Cranberry, grape and apple juices Sports drinks like Gatorade Lightly seasoned clear broth or consume(fat free) Sugar, honey syrup   _____________________________________________________________________       Take these medicines the morning of surgery with A SIP OF WATER: Zebeta, Synthroid, Nasal spray.  BRUSH YOUR TEETH MORNING OF SURGERY AND RINSE YOUR MOUTH OUT, NO CHEWING GUM CANDY OR  MINTS.                                 You may not have any metal on your  body including hair pins and              piercings     Do not wear jewelry,lotions, powders, deodorant                         Men may shave face and neck.   Do not bring valuables to the hospital. [place].  Contacts, dentures or bridgework may not be worn into surgery.  Leave suitcase in the car. After surgery it may be brought to your room.                                                                                                            Edward Foster - Preparing for Surgery Before surgery, you can play an important role.  Because skin is not sterile, your skin needs to be as free of germs as possible.  You can reduce the number of germs on your skin by washing with CHG (chlorahexidine gluconate) soap before surgery.  CHG is an antiseptic cleaner which kills germs and bonds with the skin to continue killing germs even after washing. Please DO NOT use if you have an allergy to CHG or antibacterial soaps.  If your skin becomes reddened/irritated stop using the CHG and inform your nurse when you arrive at Short Stay. Do not shave (including legs and underarms) for at least 48 hours prior to the first CHG shower.  You may shave your face/neck.  Please follow these instructions carefully:  1.  Shower with CHG Soap the night before surgery and the  morning of surgery.  2.  If you choose to wash your hair, wash your hair first as usual with your normal  shampoo.  3.  After you shampoo, rinse your hair and body thoroughly to remove the shampoo.                             4.  Use CHG as you would any other liquid soap.  You can apply chg directly to the skin and wash.  Gently with a scrungie or clean washcloth.  5.  Apply the CHG Soap to your body ONLY FROM THE NECK DOWN.   Do   not use on face/ open                           Wound or open sores. Avoid contact  with eyes, ears mouth and   genitals (private parts).                       Wash face,  Genitals (private parts) with your normal soap.             6.  Wash thoroughly, paying special attention to the area where your    surgery  will  be performed.  7.  Thoroughly rinse your body with warm water from the neck down.  8.  DO NOT shower/wash with your normal soap after using and rinsing off the CHG Soap.                9.  Pat yourself dry with a clean towel.            10.  Wear clean pajamas.            11.  Place clean sheets on your bed the night of your first shower and do not  sleep with pets. Day of Surgery : Do not apply any lotions/deodorants the morning of surgery.  Please wear clean clothes to the hospital/surgery center.  FAILURE TO FOLLOW THESE INSTRUCTIONS MAY RESULT IN THE CANCELLATION OF YOUR SURGERY  PATIENT SIGNATURE_________________________________  NURSE SIGNATURE__________________________________  ________________________________________________________________________    Edward Foster  An incentive spirometer is a tool that can help keep your lungs clear and active. This tool measures how well you are filling your lungs with each breath. Taking long deep breaths may help reverse or decrease the chance of developing breathing (pulmonary) problems (especially infection) following:  A long period of time when you are unable to move or be active. BEFORE THE PROCEDURE   If the spirometer includes an indicator to show your best effort, your nurse or respiratory therapist will set it to a desired goal.  If possible, sit up straight or lean slightly forward. Try not to slouch.  Hold the incentive spirometer in an upright position. INSTRUCTIONS FOR USE  1. Sit on the edge of your bed if possible, or sit up as far as you can in bed or on a chair. 2. Hold the incentive spirometer in an upright position. 3. Breathe out normally. 4. Place the mouthpiece in your mouth  and seal your lips tightly around it. 5. Breathe in slowly and as deeply as possible, raising the piston or the ball toward the top of the column. 6. Hold your breath for 3-5 seconds or for as long as possible. Allow the piston or ball to fall to the bottom of the column. 7. Remove the mouthpiece from your mouth and breathe out normally. 8. Rest for a few seconds and repeat Steps 1 through 7 at least 10 times every 1-2 hours when you are awake. Take your time and take a few normal breaths between deep breaths. 9. The spirometer may include an indicator to show your best effort. Use the indicator as a goal to work toward during each repetition. 10. After each set of 10 deep breaths, practice coughing to be sure your lungs are clear. If you have an incision (the cut made at the time of surgery), support your incision when coughing by placing a pillow or rolled up towels firmly against it. Once you are able to get out of bed, walk around indoors and cough well. You may stop using the incentive spirometer when instructed by your caregiver.  RISKS AND COMPLICATIONS  Take your time so you do not get dizzy or light-headed.  If you are in pain, you may need to take or ask for pain medication before doing incentive spirometry. It is harder to take a deep breath if you are having pain. AFTER USE  Rest and breathe slowly and easily.  It can be helpful to keep track of a log of your progress. Your caregiver can provide you with a simple table to help with this.  If you are using the spirometer at home, follow these instructions: [place] IF:   You are having difficultly using the spirometer.  You have trouble using the spirometer as often as instructed.  Your pain medication is not giving enough relief while using the spirometer.  You develop fever of 100.5 F (38.1 C) or higher. SEEK IMMEDIATE MEDICAL CARE IF:   You cough up bloody sputum that had not been present before.  You develop  fever of 102 F (38.9 C) or greater.  You develop worsening pain at or near the incision site. MAKE SURE YOU:   Understand these instructions.  Will watch your condition.  Will get help right away if you are not doing well or get worse. Document Released: 11/02/2006 Document Revised: 09/14/2011 Document Reviewed: 01/03/2007 [facility] Patient Information 2014 [place], [place].   ________________________________________________________________________

## 2019-05-25 ENCOUNTER — Encounter (HOSPITAL_COMMUNITY): Payer: Self-pay

## 2019-05-25 ENCOUNTER — Other Ambulatory Visit (HOSPITAL_COMMUNITY)
Admission: RE | Admit: 2019-05-25 | Discharge: 2019-05-25 | Disposition: A | Discharge status: Home / Self Care | Payer: PPO | Source: Ambulatory Visit | Attending: Orthopedic Surgery | Admitting: Orthopedic Surgery

## 2019-05-25 ENCOUNTER — Ambulatory Visit (HOSPITAL_COMMUNITY)
Admission: RE | Admit: 2019-05-25 | Discharge: 2019-05-25 | Disposition: A | Discharge status: Home / Self Care | Payer: PPO | Source: Ambulatory Visit | Attending: Orthopedic Surgery | Admitting: Orthopedic Surgery

## 2019-05-25 ENCOUNTER — Encounter (HOSPITAL_COMMUNITY)
Admission: RE | Admit: 2019-05-25 | Discharge: 2019-05-25 | Disposition: A | Discharge status: Home / Self Care | Payer: PPO | Source: Ambulatory Visit | Attending: Orthopedic Surgery | Admitting: Orthopedic Surgery

## 2019-05-25 ENCOUNTER — Other Ambulatory Visit: Payer: Self-pay

## 2019-05-25 ENCOUNTER — Inpatient Hospital Stay (HOSPITAL_COMMUNITY): Admission: RE | Admit: 2019-05-25 | Payer: PPO | Source: Ambulatory Visit

## 2019-05-25 DIAGNOSIS — Z01818 Encounter for other preprocedural examination: Secondary | ICD-10-CM | POA: Diagnosis present

## 2019-05-25 DIAGNOSIS — M1611 Unilateral primary osteoarthritis, right hip: Secondary | ICD-10-CM | POA: Insufficient documentation

## 2019-05-25 DIAGNOSIS — Z87891 Personal history of nicotine dependence: Secondary | ICD-10-CM | POA: Diagnosis not present

## 2019-05-25 DIAGNOSIS — C61 Malignant neoplasm of prostate: Secondary | ICD-10-CM | POA: Diagnosis not present

## 2019-05-25 DIAGNOSIS — E039 Hypothyroidism, unspecified: Secondary | ICD-10-CM | POA: Diagnosis not present

## 2019-05-25 DIAGNOSIS — Z79899 Other long term (current) drug therapy: Secondary | ICD-10-CM | POA: Diagnosis not present

## 2019-05-25 DIAGNOSIS — I447 Left bundle-branch block, unspecified: Secondary | ICD-10-CM | POA: Diagnosis not present

## 2019-05-25 DIAGNOSIS — J9811 Atelectasis: Secondary | ICD-10-CM | POA: Diagnosis not present

## 2019-05-25 DIAGNOSIS — Z20828 Contact with and (suspected) exposure to other viral communicable diseases: Secondary | ICD-10-CM | POA: Diagnosis not present

## 2019-05-25 DIAGNOSIS — I422 Other hypertrophic cardiomyopathy: Secondary | ICD-10-CM | POA: Insufficient documentation

## 2019-05-25 LAB — CBC WITH DIFFERENTIAL/PLATELET
Abs Immature Granulocytes: 0.02 10*3/uL (ref 0.00–0.07)
Basophils Absolute: 0.1 10*3/uL (ref 0.0–0.1)
Basophils Relative: 1 %
Eosinophils Absolute: 0.2 10*3/uL (ref 0.0–0.5)
Eosinophils Relative: 2 %
HCT: 41.8 % (ref 39.0–52.0)
Hemoglobin: 13.1 g/dL (ref 13.0–17.0)
Immature Granulocytes: 0 %
Lymphocytes Relative: 22 %
Lymphs Abs: 1.5 10*3/uL (ref 0.7–4.0)
MCH: 31.9 pg (ref 26.0–34.0)
MCHC: 31.3 g/dL (ref 30.0–36.0)
MCV: 101.7 fL — ABNORMAL HIGH (ref 80.0–100.0)
Monocytes Absolute: 0.6 10*3/uL (ref 0.1–1.0)
Monocytes Relative: 9 %
Neutro Abs: 4.6 10*3/uL (ref 1.7–7.7)
Neutrophils Relative %: 66 %
Platelets: 177 10*3/uL (ref 150–400)
RBC: 4.11 MIL/uL — ABNORMAL LOW (ref 4.22–5.81)
RDW: 13.6 % (ref 11.5–15.5)
WBC: 7 10*3/uL (ref 4.0–10.5)
nRBC: 0 % (ref 0.0–0.2)

## 2019-05-25 LAB — URINALYSIS, ROUTINE W REFLEX MICROSCOPIC
Bilirubin Urine: NEGATIVE
Glucose, UA: NEGATIVE mg/dL
Hgb urine dipstick: NEGATIVE
Ketones, ur: NEGATIVE mg/dL
Leukocytes,Ua: NEGATIVE
Nitrite: NEGATIVE
Protein, ur: NEGATIVE mg/dL
Specific Gravity, Urine: 1.017 (ref 1.005–1.030)
pH: 5 (ref 5.0–8.0)

## 2019-05-25 LAB — BASIC METABOLIC PANEL
Anion gap: 9 (ref 5–15)
BUN: 22 mg/dL (ref 8–23)
CO2: 27 mmol/L (ref 22–32)
Calcium: 9 mg/dL (ref 8.9–10.3)
Chloride: 106 mmol/L (ref 98–111)
Creatinine, Ser: 1.3 mg/dL — ABNORMAL HIGH (ref 0.61–1.24)
GFR calc Af Amer: 54 mL/min — ABNORMAL LOW (ref >60)
GFR calc non Af Amer: 47 mL/min — ABNORMAL LOW (ref >60)
Glucose, Bld: 87 mg/dL (ref 70–99)
Potassium: 4.6 mmol/L (ref 3.5–5.1)
Sodium: 142 mmol/L (ref 135–145)

## 2019-05-25 LAB — SURGICAL PCR SCREEN
MRSA, PCR: NEGATIVE
Staphylococcus aureus: NEGATIVE

## 2019-05-25 LAB — APTT: aPTT: 34 seconds (ref 24–36)

## 2019-05-25 LAB — PROTIME-INR
INR: 1.1 (ref 0.8–1.2)
Prothrombin Time: 14 seconds (ref 11.4–15.2)

## 2019-05-25 NOTE — Progress Notes (Signed)
PCP -  Rodena Piety   Cardiologist - Kathyrn Drown NP w/ clearance date 05-09-19 in [facility]  Chest x-ray - 05-25-19   EKG -  02-07-19    Stress Test -  ECHO -  Cardiac Cath -   Sleep Study -  CPAP -   Fasting Blood Sugar -  Checks Blood Sugar _____ times a day  Blood Thinner Instructions: Aspirin Instructions: Last Dose:  Anesthesia review: Hx of L BBB  Patient denies shortness of breath, fever, cough and chest pain at PAT appointment   Patient verbalized understanding of instructions that were given to them at the PAT appointment. Patient was also instructed that they will need to review over the PAT instructions again at home before surgery.

## 2019-05-26 ENCOUNTER — Other Ambulatory Visit: Payer: Self-pay | Admitting: Orthopedic Surgery

## 2019-05-26 LAB — NOVEL CORONAVIRUS, NAA (HOSP ORDER, SEND-OUT TO REF LAB; TAT 18-24 HRS): SARS-CoV-2, NAA: NOT DETECTED

## 2019-05-26 LAB — ABO/RH: ABO/RH(D): O NEG

## 2019-05-26 NOTE — H&P (Signed)
TOTAL HIP ADMISSION H&P  Patient is admitted for right total hip arthroplasty.  Subjective:  Chief Complaint: right hip pain  HPI: Cleve Paolillo, 85 y.o. male, has a history of pain and functional disability in the right hip(s) due to arthritis and patient has failed non-surgical conservative treatments for greater than 12 weeks to include NSAID's and/or analgesics, corticosteriod injections, use of assistive devices and activity modification.  Onset of symptoms was gradual starting >10 years ago with gradually worsening course since that time.The patient noted no past surgery on the right hip(s).  Patient currently rates pain in the right hip at 10 out of 10 with activity. Patient has night pain, worsening of pain with activity and weight bearing, trendelenberg gait, pain that interfers with activities of daily living and pain with passive range of motion. Patient has evidence of periarticular osteophytes and joint space narrowing by imaging studies. This condition presents safety issues increasing the risk of falls.  There is no current active infection.  Patient Active Problem List   Diagnosis Date Noted  . Bunionette of left foot 01/19/2019  . OA (osteoarthritis) of hip 01/19/2019  . Greater trochanteric bursitis of right hip 12/22/2018  . Leg length discrepancy 12/22/2018  . Hypertrophic obstructive cardiomyopathy(425.11) 07/19/2012   Past Medical History:  Diagnosis Date  . Heart murmur   . HOH (hard of hearing)   . Hypertrophic cardiomyopathy ([place])    Echo 5/18: severe conc LVH, EF 50-55, LVOT gradient not well characterized but peak velocity 2.5 m/sec; mild to mod AS (mean 19, peak 33), mild AI, severe post MAC, mod LAE, PFO cannot be excluded  . Hypothyroidism   . Prostate cancer [facility])     Past Surgical History:  Procedure Laterality Date  . SEED IMPLANTS - PROSTATE      No current facility-administered medications for this encounter.    Current Outpatient Medications   Medication Sig Dispense Refill Last Dose  . acetaminophen (TYLENOL) 500 MG tablet Take 500 mg by mouth every 6 (six) hours as needed for mild pain or moderate pain.     . bisoprolol (ZEBETA) 5 MG tablet Take 0.5 tablets (2.5 mg total) by mouth daily. 45 tablet 1   . Calcium Carbonate (CALCIUM 600 PO) Take 600 mg by mouth daily.      Marland Kitchen ibuprofen (ADVIL,MOTRIN) 600 MG tablet Take 1 tablet (600 mg total) by mouth every 6 (six) hours as needed. (Patient taking differently: Take 200 mg by mouth every 6 (six) hours as needed for mild pain or moderate pain. ) 30 tablet 0   . ipratropium (ATROVENT) 0.06 % nasal spray Place 1 spray into both nostrils daily as needed for rhinitis.   2   . levothyroxine (SYNTHROID, LEVOTHROID) 125 MCG tablet Take 125 mcg by mouth daily.     . Multiple Vitamins-Minerals (OCUVITE ADULT 50+) CAPS Take 1 capsule by mouth daily.      No Known Allergies  Social History   Tobacco Use  . Smoking status: Former Smoker    Years: 20.00    Quit date: 07/19/1973    Years since quitting: 45.8  . Smokeless tobacco: Former Network engineer Use Topics  . Alcohol use: Yes    Alcohol/week: 8.0 standard drinks    Types: 7 Standard drinks or equivalent, 1 Shots of liquor per week    Family History  Problem Relation Age of Onset  . Diabetes Mother      Review of Systems  Constitutional: Negative.   HENT:  Negative.   Eyes: Negative.   Respiratory: Negative.   Cardiovascular: Negative.   Gastrointestinal: Negative.   Genitourinary: Negative.   Musculoskeletal: Positive for joint pain.  Skin: Negative.   Neurological: Negative.   Endo/Heme/Allergies: Negative.   Psychiatric/Behavioral: Negative.     Objective:  Physical Exam  Constitutional: He is oriented to person, place, and time. He appears well-developed and well-nourished.  HENT:  Head: Normocephalic and atraumatic.  Neck: Normal range of motion. Neck supple.  Cardiovascular: Intact distal pulses.  Respiratory:  Effort normal.  Musculoskeletal:        General: Tenderness present.     Comments: Patient walks a profound right-sided limp using a cane.  Internal rotation is -5 on the right 20 on the left external rotation is 20 on the right 40 on the left and there is pain with any rotation of the right hip.  Foot tap is negative.  Neurovascular intact distally.  No cuts scrapes or abrasions to the skin.    Neurological: He is alert and oriented to person, place, and time.  Skin: Skin is warm and dry.  Psychiatric: He has a normal mood and affect. His behavior is normal. Judgment and thought content normal.    Vital signs in last 24 hours: Temp:  [97.7 F (36.5 C)] 97.7 F (36.5 C) (11/19 1305) Pulse Rate:  [47] 47 (11/19 1305) Resp:  [16] 16 (11/19 1305) BP: (140)/(46) 140/46 (11/19 1305) SpO2:  [99 %] 99 % (11/19 1305) Weight:  [75.4 kg] 75.4 kg (11/19 1305)  Labs:   Estimated body mass index is 23.84 kg/m as calculated from the following:   Height as of 05/25/19: _0  (1.778 m).   Weight as of 05/25/19: 75.4 kg.   Imaging Review Plain radiographs demonstrate bone-on-bone arthritis partial collapse of the femoral head about 20% and subchondral cysts.   Assessment/Plan:  End stage arthritis, right hip(s)  The patient history, physical examination, clinical judgement of the provider and imaging studies are consistent with end stage degenerative joint disease of the right hip(s) and total hip arthroplasty is deemed medically necessary. The treatment options including medical management, injection therapy, arthroscopy and arthroplasty were discussed at length. The risks and benefits of total hip arthroplasty were presented and reviewed. The risks due to aseptic loosening, infection, stiffness, dislocation/subluxation,  thromboembolic complications and other imponderables were discussed.  The patient acknowledged the explanation, agreed to proceed with the plan and consent was signed. Patient  is being admitted for inpatient treatment for surgery, pain control, PT, OT, prophylactic antibiotics, VTE prophylaxis, progressive ambulation and ADL's and discharge planning.The patient is planning to be discharged home with home health services    Patient's anticipated LOS is less than 2 midnights, meeting these requirements: - Younger than 61 - Lives within 1 hour of care - Has a competent adult at home to recover with post-op recover - NO history of  - Chronic pain requiring opiods  - Diabetes  - Coronary Artery Disease  - Heart failure  - Heart attack  - Stroke  - DVT/VTE  - Cardiac arrhythmia  - Respiratory Failure/COPD  - Renal failure  - Anemia  - Advanced Liver disease

## 2019-05-26 NOTE — Progress Notes (Addendum)
Anesthesia Chart Review   Case: [id number] Date/Time: 05/29/19 0700   Procedure: RIGHT TOTAL HIP ARTHROPLASTY ANTERIOR APPROACH (Right Hip)   Anesthesia type: Spinal   Pre-op diagnosis: RIGHT HIP OSTEOARTHRITIS   Location: [place] [facility] / [facility]   Surgeon: Frederik Pear, MD      DISCUSSION:85 y.o. former smoker (quit 07/19/73) with h/o hypothyroidism, hypertrophic cardiomyopathy, LBBB, mild to moderate aortic stenosis, prostate cancer, right hip OA scheduled for above procedure 05/29/2019 with Dr. Frederik Pear.   Cleared by cardiology 05/09/2019.  Per Kathyrn Drown, NP, "Given past medical history and time since last visit, based on ACC/AHA guidelines, Edward Foster would be at acceptable risk for the planned procedure without further cardiovascular testing."  Discussed with Dr. Royce Macadamia.  Anticipate pt can proceed with planned procedure barring acute status change.   VS: BP (!) 140/46 (BP Location: Right Arm)   Pulse (!) 47   Temp 36.5 C (Oral)   Resp 16   Ht _0  (1.778 m)   Wt 75.4 kg   SpO2 99%   BMI 23.84 kg/m   PROVIDERS: Velna Hatchet, MD is PCP   Lauree Chandler, MD is Cardiologist  LABS: Labs reviewed: Acceptable for surgery. (all labs ordered are listed, but only abnormal results are displayed)  Labs Reviewed  BASIC METABOLIC PANEL - Abnormal; Notable for the following components:      Result Value   Creatinine, Ser 1.30 (*)    GFR calc non Af Amer 47 (*)    GFR calc Af Amer 54 (*)    All other components within normal limits  CBC WITH DIFFERENTIAL/PLATELET - Abnormal; Notable for the following components:   RBC 4.11 (*)    MCV 101.7 (*)    All other components within normal limits  SURGICAL PCR SCREEN  APTT  PROTIME-INR  URINALYSIS, ROUTINE W REFLEX MICROSCOPIC  TYPE AND SCREEN  ABO/RH     IMAGES: Chest Xray 05/25/2019 IMPRESSION: Minimal atelectasis or scarring at LEFT base.  Question BILATERAL nipple shadow; repeat PA chest radiograph  with nipple markers recommended to exclude pulmonary nodules.  These results will be called to the ordering clinician or representative by the Radiologist Assistant, and communication documented in the PACS or zVision Dashboard.  EKG: 02/07/2019 Rate 54 bpm Sinus bradycardia with 1st degree AV block  Left bundle branch block   CV:L Echo 11/16/2016 Study Conclusions  - Left ventricle: SAM LVOT gradient not well characterized but peak   velocity in the 2.5 m/sec range. There was severe concentric   hypertrophy. Systolic function was normal. The estimated ejection   fraction was in the range of 50% to 55%. Doppler parameters are   consistent with abnormal left ventricular relaxation (grade 1   diastolic dysfunction). - Aortic valve: There was mild to moderate stenosis. There was mild   regurgitation. Valve area (VTI): 2.3 cm^2. Valve area (Vmax):   2.61 cm^2. Valve area (Vmean): 2.59 cm^2. Mean gradient (S): 19 mm Hg. Peak gradient (S): 33 mm Hg. - Mitral valve: Severe posterior annular calcification. Valve area   by pressure half-time: 2.5 cm^2. Valve area by continuity   equation (using LVOT flow): 3.73 cm^2. - Left atrium: The atrium was moderately dilated. - Atrial septum: A patent foramen ovale cannot be excluded. Past Medical History:  Diagnosis Date  . Heart murmur   . HOH (hard of hearing)   . Hypertrophic cardiomyopathy ([place])    Echo 5/18: severe conc LVH, EF 50-55, LVOT gradient not well characterized but  peak velocity 2.5 m/sec; mild to mod AS (mean 19, peak 33), mild AI, severe post MAC, mod LAE, PFO cannot be excluded  . Hypothyroidism   . Prostate cancer [facility])     Past Surgical History:  Procedure Laterality Date  . SEED IMPLANTS - PROSTATE      MEDICATIONS: . acetaminophen (TYLENOL) 500 MG tablet  . bisoprolol (ZEBETA) 5 MG tablet  . Calcium Carbonate (CALCIUM 600 PO)  . ibuprofen (ADVIL,MOTRIN) 600 MG tablet  . ipratropium (ATROVENT) 0.06 % nasal spray   . levothyroxine (SYNTHROID, LEVOTHROID) 125 MCG tablet  . Multiple Vitamins-Minerals (OCUVITE ADULT 50+) CAPS   No current facility-administered medications for this encounter.      Maia Plan [facility] Pre-Surgical Testing [phone] 05/26/19  10:01 AM

## 2019-05-28 MED ORDER — BUPIVACAINE LIPOSOME 1.3 % IJ SUSP
10.0000 mL | Freq: Once | INTRAMUSCULAR | Status: DC
  Filled 2019-05-28: qty 10

## 2019-05-28 MED ORDER — TRANEXAMIC ACID 1000 MG/10ML IV SOLN
2000.0000 mg | INTRAVENOUS | Status: DC
  Filled 2019-05-28: qty 20

## 2019-05-28 NOTE — Anesthesia Preprocedure Evaluation (Addendum)
Anesthesia Evaluation  Patient identified by MRN, date of birth, ID band Patient awake    Reviewed: Allergy & Precautions, H&P , NPO status , Patient's Chart, lab work & pertinent test results  Airway Mallampati: I  TM Distance: >3 FB Neck ROM: Full    Dental no notable dental hx. (+) Teeth Intact, Dental Advisory Given   Pulmonary neg pulmonary ROS, former smoker,    Pulmonary exam normal breath sounds clear to auscultation       Cardiovascular Exercise Tolerance: Good negative cardio ROS Normal cardiovascular exam+ Valvular Problems/Murmurs AS  Rhythm:Regular Rate:Normal + Systolic murmurs EKG: [id number] Rate 54 bpm Sinus bradycardia with 1st degree AV block  Left bundle branch block   CV:L Echo 11/16/2016 Study Conclusions Left ventricle: SAM LVOT gradient not well characterized but peak velocity in the 2.5 m/sec range. There was severe concentric hypertrophy. Systolic function was normal. The estimated ejection fraction was in the range of 50% to 55%. Doppler parameters are consistent with abnormal left ventricular relaxation (grade 1 diastolic dysfunction). - Aortic valve: There was mild to moderate stenosis. There was mild regurgitation. Valve area (VTI): 2.3 cm^2. Valve area (Vmax): 2.61 cm^2. Valve area (Vmean): 2.59 cm^2.   Neuro/Psych negative neurological ROS  negative psych ROS   GI/Hepatic negative GI ROS, Neg liver ROS,   Endo/Other  negative endocrine ROSHypothyroidism   Renal/GU negative Renal ROS  negative genitourinary   Musculoskeletal negative musculoskeletal ROS (+) Arthritis , Osteoarthritis,    Abdominal   Peds negative pediatric ROS (+)  Hematology negative hematology ROS (+)   Anesthesia Other Findings   Reproductive/Obstetrics negative OB ROS                            Anesthesia Physical Anesthesia Plan  ASA: III  Anesthesia Plan:  Spinal   Post-op Pain Management:    Induction:   PONV Risk Score and Plan: 2  Airway Management Planned: Nasal Cannula, Simple Face Mask and Mask  Additional Equipment:   Intra-op Plan:   Post-operative Plan:   Informed Consent: I have reviewed the patients History and Physical, chart, labs and discussed the procedure including the risks, benefits and alternatives for the proposed anesthesia with the patient or authorized representative who has indicated his/her understanding and acceptance.       Plan Discussed with: CRNA, Anesthesiologist and Surgeon  Anesthesia Plan Comments: (  )       Anesthesia Quick Evaluation

## 2019-05-29 ENCOUNTER — Other Ambulatory Visit: Payer: Self-pay

## 2019-05-29 ENCOUNTER — Encounter (HOSPITAL_COMMUNITY)
Admission: RE | Disposition: A | Discharge status: Home / Self Care | Payer: Self-pay | Source: Ambulatory Visit | Attending: Orthopedic Surgery

## 2019-05-29 ENCOUNTER — Ambulatory Visit (HOSPITAL_COMMUNITY): Payer: PPO

## 2019-05-29 ENCOUNTER — Encounter (HOSPITAL_COMMUNITY): Payer: Self-pay | Admitting: Registered Nurse

## 2019-05-29 ENCOUNTER — Ambulatory Visit (HOSPITAL_COMMUNITY): Payer: PPO | Admitting: Physician Assistant

## 2019-05-29 ENCOUNTER — Ambulatory Visit (HOSPITAL_COMMUNITY): Payer: PPO | Admitting: Registered Nurse

## 2019-05-29 ENCOUNTER — Observation Stay (HOSPITAL_COMMUNITY)
Admission: RE | Admit: 2019-05-29 | Discharge: 2019-05-29 | Disposition: A | Discharge status: Home / Self Care | Payer: PPO | Source: Ambulatory Visit | Attending: Orthopedic Surgery | Admitting: Orthopedic Surgery

## 2019-05-29 DIAGNOSIS — Z471 Aftercare following joint replacement surgery: Secondary | ICD-10-CM | POA: Diagnosis not present

## 2019-05-29 DIAGNOSIS — I421 Obstructive hypertrophic cardiomyopathy: Secondary | ICD-10-CM | POA: Diagnosis not present

## 2019-05-29 DIAGNOSIS — M25551 Pain in right hip: Secondary | ICD-10-CM | POA: Diagnosis present

## 2019-05-29 DIAGNOSIS — E039 Hypothyroidism, unspecified: Secondary | ICD-10-CM | POA: Insufficient documentation

## 2019-05-29 DIAGNOSIS — Z87891 Personal history of nicotine dependence: Secondary | ICD-10-CM | POA: Insufficient documentation

## 2019-05-29 DIAGNOSIS — Z419 Encounter for procedure for purposes other than remedying health state, unspecified: Secondary | ICD-10-CM

## 2019-05-29 DIAGNOSIS — Z79890 Hormone replacement therapy (postmenopausal): Secondary | ICD-10-CM | POA: Diagnosis not present

## 2019-05-29 DIAGNOSIS — M1611 Unilateral primary osteoarthritis, right hip: Principal | ICD-10-CM | POA: Diagnosis present

## 2019-05-29 DIAGNOSIS — Z8546 Personal history of malignant neoplasm of prostate: Secondary | ICD-10-CM | POA: Diagnosis not present

## 2019-05-29 DIAGNOSIS — C61 Malignant neoplasm of prostate: Secondary | ICD-10-CM | POA: Diagnosis not present

## 2019-05-29 DIAGNOSIS — Z01818 Encounter for other preprocedural examination: Secondary | ICD-10-CM

## 2019-05-29 DIAGNOSIS — Z96641 Presence of right artificial hip joint: Secondary | ICD-10-CM

## 2019-05-29 DIAGNOSIS — M7061 Trochanteric bursitis, right hip: Secondary | ICD-10-CM | POA: Diagnosis not present

## 2019-05-29 HISTORY — PX: TOTAL HIP ARTHROPLASTY: SHX124

## 2019-05-29 LAB — TYPE AND SCREEN
ABO/RH(D): O NEG
Antibody Screen: NEGATIVE

## 2019-05-29 SURGERY — ARTHROPLASTY, HIP, TOTAL, ANTERIOR APPROACH
Anesthesia: Spinal | Site: Hip | Laterality: Right

## 2019-05-29 MED ORDER — EPHEDRINE SULFATE-NACL 50-0.9 MG/10ML-% IV SOSY
PREFILLED_SYRINGE | INTRAVENOUS | Status: DC | PRN
  Administered 2019-05-29 (×2): 15 mg via INTRAVENOUS

## 2019-05-29 MED ORDER — BUPIVACAINE IN DEXTROSE 0.75-8.25 % IT SOLN
INTRATHECAL | Status: DC | PRN
  Administered 2019-05-29: 1.6 mL via INTRATHECAL

## 2019-05-29 MED ORDER — DIPHENHYDRAMINE HCL 12.5 MG/5ML PO ELIX: 12.5000 mg | ORAL_SOLUTION | ORAL | Status: DC | PRN

## 2019-05-29 MED ORDER — DEXAMETHASONE SODIUM PHOSPHATE 10 MG/ML IJ SOLN
INTRAMUSCULAR | Status: DC | PRN
  Administered 2019-05-29: 10 mg via INTRAVENOUS

## 2019-05-29 MED ORDER — METOCLOPRAMIDE HCL 5 MG PO TABS: 5.0000 mg | ORAL_TABLET | Freq: Three times a day (TID) | ORAL | Status: DC | PRN

## 2019-05-29 MED ORDER — BUPIVACAINE LIPOSOME 1.3 % IJ SUSP
INTRAMUSCULAR | Status: DC | PRN
  Administered 2019-05-29: 10 mL

## 2019-05-29 MED ORDER — BISOPROLOL FUMARATE 5 MG PO TABS: 2.5000 mg | ORAL_TABLET | Freq: Every day | ORAL | Status: DC

## 2019-05-29 MED ORDER — LIDOCAINE 2% (20 MG/ML) 5 ML SYRINGE
INTRAMUSCULAR | Status: DC | PRN
  Administered 2019-05-29: 40 mg via INTRAVENOUS

## 2019-05-29 MED ORDER — TRANEXAMIC ACID-NACL 1000-0.7 MG/100ML-% IV SOLN
1000.0000 mg | INTRAVENOUS | Status: AC
  Administered 2019-05-29: 1000 mg via INTRAVENOUS

## 2019-05-29 MED ORDER — 0.9 % SODIUM CHLORIDE (POUR BTL) OPTIME
TOPICAL | Status: DC | PRN
  Administered 2019-05-29: 1000 mL

## 2019-05-29 MED ORDER — PROPOFOL 10 MG/ML IV BOLUS
INTRAVENOUS | Status: AC
  Filled 2019-05-29: qty 20

## 2019-05-29 MED ORDER — CEFAZOLIN SODIUM-DEXTROSE 2-4 GM/100ML-% IV SOLN
INTRAVENOUS | Status: AC
  Filled 2019-05-29: qty 100

## 2019-05-29 MED ORDER — ALUM & MAG HYDROXIDE-SIMETH 200-200-20 MG/5ML PO SUSP: 30.0000 mL | ORAL | Status: DC | PRN

## 2019-05-29 MED ORDER — FENTANYL CITRATE (PF) 100 MCG/2ML IJ SOLN
INTRAMUSCULAR | Status: DC | PRN
  Administered 2019-05-29 (×2): 50 ug via INTRAVENOUS

## 2019-05-29 MED ORDER — ACETAMINOPHEN 160 MG/5ML PO SOLN: 325.0000 mg | ORAL | Status: DC | PRN

## 2019-05-29 MED ORDER — METOCLOPRAMIDE HCL 5 MG/ML IJ SOLN: 5.0000 mg | Freq: Three times a day (TID) | INTRAMUSCULAR | Status: DC | PRN

## 2019-05-29 MED ORDER — BISACODYL 5 MG PO TBEC: 5.0000 mg | DELAYED_RELEASE_TABLET | Freq: Every day | ORAL | Status: DC | PRN

## 2019-05-29 MED ORDER — LIDOCAINE 2% (20 MG/ML) 5 ML SYRINGE
INTRAMUSCULAR | Status: AC
  Filled 2019-05-29: qty 5

## 2019-05-29 MED ORDER — IPRATROPIUM BROMIDE 0.06 % NA SOLN
1.0000 | Freq: Every day | NASAL | Status: DC | PRN
  Filled 2019-05-29: qty 15

## 2019-05-29 MED ORDER — PHENYLEPHRINE HCL-NACL 10-0.9 MG/250ML-% IV SOLN
INTRAVENOUS | Status: DC | PRN
  Administered 2019-05-29: 40 ug/min via INTRAVENOUS

## 2019-05-29 MED ORDER — DOCUSATE SODIUM 100 MG PO CAPS: 100.0000 mg | ORAL_CAPSULE | Freq: Two times a day (BID) | ORAL | Status: DC

## 2019-05-29 MED ORDER — TIZANIDINE HCL 2 MG PO TABS: 2.0000 mg | ORAL_TABLET | Freq: Four times a day (QID) | ORAL | 0 refills | Status: DC | PRN

## 2019-05-29 MED ORDER — POVIDONE-IODINE 10 % EX SWAB
2.0000 "application " | Freq: Once | CUTANEOUS | Status: AC
  Administered 2019-05-29: 2 via TOPICAL

## 2019-05-29 MED ORDER — POLYETHYLENE GLYCOL 3350 17 G PO PACK: 17.0000 g | PACK | Freq: Every day | ORAL | Status: DC | PRN

## 2019-05-29 MED ORDER — ASPIRIN EC 81 MG PO TBEC: 81.0000 mg | DELAYED_RELEASE_TABLET | Freq: Two times a day (BID) | ORAL | 0 refills | Status: DC

## 2019-05-29 MED ORDER — PHENYLEPHRINE HCL-NACL 10-0.9 MG/250ML-% IV SOLN
INTRAVENOUS | Status: AC
  Filled 2019-05-29: qty 250

## 2019-05-29 MED ORDER — TRANEXAMIC ACID-NACL 1000-0.7 MG/100ML-% IV SOLN
1000.0000 mg | Freq: Once | INTRAVENOUS | Status: AC
  Administered 2019-05-29: 1000 mg via INTRAVENOUS
  Filled 2019-05-29: qty 100

## 2019-05-29 MED ORDER — METHOCARBAMOL 500 MG IVPB - SIMPLE MED
500.0000 mg | Freq: Four times a day (QID) | INTRAVENOUS | Status: DC | PRN
  Filled 2019-05-29: qty 50

## 2019-05-29 MED ORDER — CELECOXIB 200 MG PO CAPS: 200.0000 mg | ORAL_CAPSULE | Freq: Two times a day (BID) | ORAL | Status: DC

## 2019-05-29 MED ORDER — DEXAMETHASONE SODIUM PHOSPHATE 10 MG/ML IJ SOLN
INTRAMUSCULAR | Status: AC
  Filled 2019-05-29: qty 1

## 2019-05-29 MED ORDER — OXYCODONE-ACETAMINOPHEN 5-325 MG PO TABS: 1.0000 | ORAL_TABLET | ORAL | 0 refills | Status: DC | PRN

## 2019-05-29 MED ORDER — BUPIVACAINE-EPINEPHRINE 0.25% -1:200000 IJ SOLN
INTRAMUSCULAR | Status: DC | PRN
  Administered 2019-05-29: 30 mL

## 2019-05-29 MED ORDER — OXYCODONE HCL 5 MG PO TABS: 5.0000 mg | ORAL_TABLET | Freq: Once | ORAL | Status: DC | PRN

## 2019-05-29 MED ORDER — TRANEXAMIC ACID-NACL 1000-0.7 MG/100ML-% IV SOLN
INTRAVENOUS | Status: AC
  Filled 2019-05-29: qty 100

## 2019-05-29 MED ORDER — DEXAMETHASONE SODIUM PHOSPHATE 10 MG/ML IJ SOLN: 10.0000 mg | Freq: Once | INTRAMUSCULAR | Status: DC

## 2019-05-29 MED ORDER — HYDROMORPHONE HCL 1 MG/ML IJ SOLN: 0.5000 mg | INTRAMUSCULAR | Status: DC | PRN

## 2019-05-29 MED ORDER — MENTHOL 3 MG MT LOZG: 1.0000 | LOZENGE | OROMUCOSAL | Status: DC | PRN

## 2019-05-29 MED ORDER — BUPIVACAINE-EPINEPHRINE 0.25% -1:200000 IJ SOLN
INTRAMUSCULAR | Status: AC
  Filled 2019-05-29: qty 1

## 2019-05-29 MED ORDER — PROSIGHT PO TABS: 1.0000 | ORAL_TABLET | Freq: Every day | ORAL | Status: DC

## 2019-05-29 MED ORDER — CEFAZOLIN SODIUM-DEXTROSE 2-4 GM/100ML-% IV SOLN
2.0000 g | INTRAVENOUS | Status: AC
  Administered 2019-05-29: 2 g via INTRAVENOUS

## 2019-05-29 MED ORDER — EPHEDRINE 5 MG/ML INJ
INTRAVENOUS | Status: AC
  Filled 2019-05-29: qty 10

## 2019-05-29 MED ORDER — LEVOTHYROXINE SODIUM 125 MCG PO TABS: 125.0000 ug | ORAL_TABLET | Freq: Every day | ORAL | Status: DC

## 2019-05-29 MED ORDER — PANTOPRAZOLE SODIUM 40 MG PO TBEC: 40.0000 mg | DELAYED_RELEASE_TABLET | Freq: Every day | ORAL | Status: DC

## 2019-05-29 MED ORDER — FENTANYL CITRATE (PF) 100 MCG/2ML IJ SOLN
INTRAMUSCULAR | Status: AC
  Filled 2019-05-29: qty 2

## 2019-05-29 MED ORDER — GABAPENTIN 100 MG PO CAPS
100.0000 mg | ORAL_CAPSULE | Freq: Three times a day (TID) | ORAL | Status: DC
  Administered 2019-05-29: 100 mg via ORAL
  Filled 2019-05-29: qty 1

## 2019-05-29 MED ORDER — SODIUM CHLORIDE (PF) 0.9 % IJ SOLN
INTRAMUSCULAR | Status: AC
  Filled 2019-05-29: qty 50

## 2019-05-29 MED ORDER — ONDANSETRON HCL 4 MG/2ML IJ SOLN
INTRAMUSCULAR | Status: DC | PRN
  Administered 2019-05-29: 4 mg via INTRAVENOUS

## 2019-05-29 MED ORDER — ONDANSETRON HCL 4 MG/2ML IJ SOLN: 4.0000 mg | Freq: Once | INTRAMUSCULAR | Status: DC | PRN

## 2019-05-29 MED ORDER — PROPOFOL 500 MG/50ML IV EMUL
INTRAVENOUS | Status: AC
  Filled 2019-05-29: qty 50

## 2019-05-29 MED ORDER — ASPIRIN 81 MG PO CHEW: 81.0000 mg | CHEWABLE_TABLET | Freq: Two times a day (BID) | ORAL | Status: DC

## 2019-05-29 MED ORDER — ONDANSETRON HCL 4 MG/2ML IJ SOLN: 4.0000 mg | Freq: Four times a day (QID) | INTRAMUSCULAR | Status: DC | PRN

## 2019-05-29 MED ORDER — OXYCODONE HCL 5 MG/5ML PO SOLN: 5.0000 mg | Freq: Once | ORAL | Status: DC | PRN

## 2019-05-29 MED ORDER — FENTANYL CITRATE (PF) 100 MCG/2ML IJ SOLN: 25.0000 ug | INTRAMUSCULAR | Status: DC | PRN

## 2019-05-29 MED ORDER — OCUVITE ADULT 50+ PO CAPS: 1.0000 | ORAL_CAPSULE | Freq: Every day | ORAL | Status: DC

## 2019-05-29 MED ORDER — PROPOFOL 10 MG/ML IV BOLUS
INTRAVENOUS | Status: DC | PRN
  Administered 2019-05-29: 20 mg via INTRAVENOUS

## 2019-05-29 MED ORDER — OXYCODONE HCL 5 MG PO TABS: 5.0000 mg | ORAL_TABLET | ORAL | Status: DC | PRN

## 2019-05-29 MED ORDER — ACETAMINOPHEN 325 MG PO TABS: 325.0000 mg | ORAL_TABLET | ORAL | Status: DC | PRN

## 2019-05-29 MED ORDER — MEPERIDINE HCL 50 MG/ML IJ SOLN: 6.2500 mg | INTRAMUSCULAR | Status: DC | PRN

## 2019-05-29 MED ORDER — LACTATED RINGERS IV SOLN
INTRAVENOUS | Status: DC
  Administered 2019-05-29 (×2): via INTRAVENOUS

## 2019-05-29 MED ORDER — ONDANSETRON HCL 4 MG/2ML IJ SOLN
INTRAMUSCULAR | Status: AC
  Filled 2019-05-29: qty 2

## 2019-05-29 MED ORDER — ACETAMINOPHEN 325 MG PO TABS: 325.0000 mg | ORAL_TABLET | Freq: Four times a day (QID) | ORAL | Status: DC | PRN

## 2019-05-29 MED ORDER — FLEET ENEMA 7-19 GM/118ML RE ENEM: 1.0000 | ENEMA | Freq: Once | RECTAL | Status: DC | PRN

## 2019-05-29 MED ORDER — PROPOFOL 500 MG/50ML IV EMUL
INTRAVENOUS | Status: DC | PRN
  Administered 2019-05-29: 25 ug/kg/min via INTRAVENOUS

## 2019-05-29 MED ORDER — ONDANSETRON HCL 4 MG PO TABS: 4.0000 mg | ORAL_TABLET | Freq: Four times a day (QID) | ORAL | Status: DC | PRN

## 2019-05-29 MED ORDER — CHLORHEXIDINE GLUCONATE 4 % EX LIQD: 60.0000 mL | Freq: Once | CUTANEOUS | Status: DC

## 2019-05-29 MED ORDER — SODIUM CHLORIDE 0.9% FLUSH
INTRAVENOUS | Status: DC | PRN
  Administered 2019-05-29: 50 mL

## 2019-05-29 MED ORDER — KCL IN DEXTROSE-NACL 20-5-0.45 MEQ/L-%-% IV SOLN
INTRAVENOUS | Status: DC
  Administered 2019-05-29: 11:00:00 via INTRAVENOUS
  Filled 2019-05-29 (×2): qty 1000

## 2019-05-29 MED ORDER — ACETAMINOPHEN 500 MG PO TABS
1000.0000 mg | ORAL_TABLET | Freq: Four times a day (QID) | ORAL | Status: DC
  Administered 2019-05-29 (×2): 1000 mg via ORAL
  Filled 2019-05-29 (×2): qty 2

## 2019-05-29 MED ORDER — PHENOL 1.4 % MT LIQD: 1.0000 | OROMUCOSAL | Status: DC | PRN

## 2019-05-29 MED ORDER — TRANEXAMIC ACID 1000 MG/10ML IV SOLN
INTRAVENOUS | Status: DC | PRN
  Administered 2019-05-29: 2000 mg via TOPICAL

## 2019-05-29 MED ORDER — METHOCARBAMOL 500 MG PO TABS: 500.0000 mg | ORAL_TABLET | Freq: Four times a day (QID) | ORAL | Status: DC | PRN

## 2019-05-29 SURGICAL SUPPLY — 49 items
BAG DECANTER FOR FLEXI CONT (MISCELLANEOUS) ×2 IMPLANT
BLADE SAW SGTL 18X1.27X75 (BLADE) ×2 IMPLANT
BLADE SURG SZ10 CARB STEEL (BLADE) ×4 IMPLANT
CONT SPEC 4OZ CLIKSEAL STRL BL (MISCELLANEOUS) ×2 IMPLANT
COVER PERINEAL POST (MISCELLANEOUS) ×2 IMPLANT
COVER SURGICAL LIGHT HANDLE (MISCELLANEOUS) ×2 IMPLANT
COVER WAND RF STERILE (DRAPES) ×1 IMPLANT
CUP ACETBLR 54 OD 100 SERIES (Hips) ×1 IMPLANT
DECANTER SPIKE VIAL GLASS SM (MISCELLANEOUS) ×4 IMPLANT
DRAPE STERI IOBAN 125X83 (DRAPES) ×2 IMPLANT
DRAPE U-SHAPE 47X51 STRL (DRAPES) ×4 IMPLANT
DRESSING AQUACEL AG SP 3.5X10 (GAUZE/BANDAGES/DRESSINGS) IMPLANT
DRSG AQUACEL AG ADV 3.5X10 (GAUZE/BANDAGES/DRESSINGS) ×2 IMPLANT
DRSG AQUACEL AG SP 3.5X10 (GAUZE/BANDAGES/DRESSINGS) ×2
DURAPREP 26ML APPLICATOR (WOUND CARE) ×2 IMPLANT
ELECT BLADE TIP CTD 4 INCH (ELECTRODE) ×2 IMPLANT
ELECT REM PT RETURN 15FT ADLT (MISCELLANEOUS) ×2 IMPLANT
ELIMINATOR HOLE APEX DEPUY (Hips) ×1 IMPLANT
GLOVE BIO SURGEON STRL SZ7.5 (GLOVE) ×2 IMPLANT
GLOVE BIO SURGEON STRL SZ8.5 (GLOVE) ×2 IMPLANT
GLOVE BIOGEL PI IND STRL 8 (GLOVE) ×1 IMPLANT
GLOVE BIOGEL PI IND STRL 9 (GLOVE) ×1 IMPLANT
GLOVE BIOGEL PI INDICATOR 8 (GLOVE) ×1
GLOVE BIOGEL PI INDICATOR 9 (GLOVE) ×1
GOWN STRL REUS W/TWL XL LVL3 (GOWN DISPOSABLE) ×4 IMPLANT
HEAD M SROM 36MM PLUS 1.5 (Hips) IMPLANT
HOLDER FOLEY CATH W/STRAP (MISCELLANEOUS) ×2 IMPLANT
KIT TURNOVER KIT A (KITS) IMPLANT
LINER NEUTRAL 54X36MM PLUS 4 (Hips) ×1 IMPLANT
MANIFOLD NEPTUNE II (INSTRUMENTS) ×2 IMPLANT
NDL HYPO 21X1.5 SAFETY (NEEDLE) ×2 IMPLANT
NEEDLE HYPO 21X1.5 SAFETY (NEEDLE) ×4 IMPLANT
NS IRRIG 1000ML POUR BTL (IV SOLUTION) ×2 IMPLANT
PACK ANTERIOR HIP CUSTOM (KITS) ×2 IMPLANT
PENCIL SMOKE EVACUATOR (MISCELLANEOUS) IMPLANT
SROM M HEAD 36MM PLUS 1.5 (Hips) ×2 IMPLANT
STEM FEMORAL SZ6 HIGH ACTIS (Stem) ×1 IMPLANT
SUT ETHIBOND NAB CT1 #1 30IN (SUTURE) ×2 IMPLANT
SUT VIC AB 0 CT1 27 (SUTURE) ×1
SUT VIC AB 0 CT1 27XBRD ANBCTR (SUTURE) ×1 IMPLANT
SUT VIC AB 1 CTX 36 (SUTURE) ×1
SUT VIC AB 1 CTX36XBRD ANBCTR (SUTURE) ×1 IMPLANT
SUT VIC AB 2-0 CT1 27 (SUTURE) ×1
SUT VIC AB 2-0 CT1 TAPERPNT 27 (SUTURE) ×1 IMPLANT
SUT VIC AB 3-0 CT1 27 (SUTURE) ×1
SUT VIC AB 3-0 CT1 TAPERPNT 27 (SUTURE) ×1 IMPLANT
SYR CONTROL 10ML LL (SYRINGE) ×6 IMPLANT
TRAY FOLEY MTR SLVR 16FR STAT (SET/KITS/TRAYS/PACK) ×1 IMPLANT
YANKAUER SUCT BULB TIP 10FT TU (MISCELLANEOUS) ×2 IMPLANT

## 2019-05-29 NOTE — Op Note (Signed)
PATIENT ID:      Edward Foster  MRN:     [id number] DOB/AGE:    [birth year] / 85 y.o.  OPERATIVE REPORT   DATE OF PROCEDURE:  05/29/2019      PREOPERATIVE DIAGNOSIS:  RIGHT HIP OSTEOARTHRITIS                                                         POSTOPERATIVE DIAGNOSIS:  RIGHT HIP OSTEOARTHRITIS                                                          PROCEDURE: Anterior R total hip arthroplasty using a 54 mm DePuy Pinnacle  Cup, [employer], 0-degree polyethylene liner, a +1.5 mm x 55mm metal head, a 6 hi Depuy Actis stem  SURGEON: Kerin Salen  ASSISTANT:   Kerry Hough. [employer]  (present throughout entire procedure and necessary for timely completion of the procedure)   ANESTHESIA: Spinal, Exparel 133mg  injection BLOOD LOSS: 300 cc FLUID REPLACEMENT: 1500 cc crystalloid TRANEXAMIC ACID: 1gm IV, 2gm Topical COMPLICATIONS: none    INDICATIONS FOR PROCEDURE: A 85 y.o. year-old With  RIGHT HIP OSTEOARTHRITIS   for 3 years, x-rays show bone-on-bone arthritic changes, and osteophytes. Despite conservative measures with observation, anti-inflammatory medicine, narcotics, use of a cane, has severe unremitting pain and can ambulate only a few blocks before resting. Patient desires elective R total hip arthroplasty to decrease pain and increase function. The risks, benefits, and alternatives were discussed at length including but not limited to the risks of infection, bleeding, nerve injury, stiffness, blood clots, the need for revision surgery, cardiopulmonary complications, among others, and they were willing to proceed. Questions answered      PROCEDURE IN DETAIL: The patient was identified by armband,   received preoperative IV antibiotics in the holding area at [facility], taken to the operating room , appropriate anesthetic monitors   were attached and anesthesia was induced with the patient on the gurney. HANA boots were applied to the feet, and the patient  was  transferred to the HANA table with a peroneal post and support underneath the non-operative leg. Theoperative lower extremity was then prepped and draped in the usual sterile fashion from just above the iliac crest to the knee. And a timeout procedure was performed. Skin along incision area was injected with 10 cc of Exparel solution. We then made a 13 cm incision along the interval at the leading edge of the tensor fascia lata of starting at 2 cm lateral to the ASIS. Small bleeders in the skin and subcutaneous tissue identified and cauterized we dissected down to the fascia and made an incision in the fascia allowing [place] to elevate the fascia of the tensor muscle and exploited the interval between the rectus and the tensor fascia lata. A Cobra retractor was then placed along the superior neck of the femur. A cerebellar retractor was used to expose the interval between the tensor fascia lata and the rectus femoris.  We identified and cauterized the ascending branch of the anterior circumflex artery. A second Cobra retractor along the inferior neck  of the femur. A small Hohmann retractor was placed underneath the origin of the rectus femoris, giving [place] good medial exposure. Using Ronguers fatty tissue was removed from in front of the anterior capsule. The capsule was then incised, starting out at the superior anterior rim of the acetabulum going laterally along the anterior neck. The capsule was then teed along the neck superiorly and inferiorly. Electrocautery was used to release capsule from the anterior and medial neck of the femur to allow external rotation. Cobra retractors were then placed along the inferior and superior neck allowing [place] to perform a standard neck cut and removed the femoral head with a power corkscrew. We then placed a medium bent homan retractor in the cotyloid notch and posteriorly along the acetabular rim a narrow Cobra retractor. Exposed labral tissue and osteophytes were then removed. We  then sequentially reamed up to a 53 mm basket reamer obtaining good coverage in all quadrants, verified by C-arm imaging. Under C-arm control we then hammered into place a 54 mm Pinnacle cup in 45 of abduction and 15 of anteversion. The cup seated nicely and required no supplemental screws. We then placed a central hole Eliminator and a 0 polyethylene liner. The foot was then externally rotated to 130-140. The limb was extended and adducted to the floor, delivering the proximal femur up into the wound. A medium curved Hohmann retractor was placed over the greater trochanter and a long Homan retractor along the posterior femoral neck completing the exposure and lateralizing the femur. We then performed releases superiorly and and inferiorly of the capsule going back to the pirformis fossa superiorly and to the lesser trochanter inferiorly. We then entered the proximal femur with the box cutting offset chisel followed by, a canal sounder, the chili pepper and broaching up to a 6 broach. This seated nicely and we reamed the calcar. A trial reduction was performed with a 1.5 mm X 36 mm head.The limb lengths were excellent the hip was stable in 90 of external rotation. At this point the trial components removed and we hammered into place a # 6hi  Offset Actis stem with Gryption coating. A + 1.5 mm x 36 metal head was then hammered into place. The hip was reduced and final C-arm images obtained. The wound was thoroughly irrigated with normal saline solution. We repaired the ant capsule and the tensor fascia lot a with running 0 vicryl suture. the subcutaneous tissue was closed with 2-0 and 3-0 Vicryl suture followed by an Aquacil dressing. At this point the patient was awaken and transferred to hospital gurney without difficulty.   Kerin Salen 05/29/2019, 7:04 AM

## 2019-05-29 NOTE — Interval H&P Note (Signed)
History and Physical Interval Note:  05/29/2019 7:03 AM  Edward Foster  has presented today for surgery, with the diagnosis of RIGHT HIP OSTEOARTHRITIS.  The various methods of treatment have been discussed with the patient and family. After consideration of risks, benefits and other options for treatment, the patient has consented to  Procedure(s): RIGHT TOTAL HIP ARTHROPLASTY ANTERIOR APPROACH (Right) as a surgical intervention.  The patient's history has been reviewed, patient examined, no change in status, stable for surgery.  I have reviewed the patient's chart and labs.  Questions were answered to the patient's satisfaction.     Kerin Salen

## 2019-05-29 NOTE — Transfer of Care (Signed)
Immediate Anesthesia Transfer of Care Note  Patient: Edward Foster  Procedure(s) Performed: RIGHT TOTAL HIP ARTHROPLASTY ANTERIOR APPROACH (Right Hip)  Patient Location: PACU  Anesthesia Type:Spinal  Level of Consciousness: awake, alert  and oriented  Airway & Oxygen Therapy: Patient Spontanous Breathing and Patient connected to face mask oxygen  Post-op Assessment: Report given to RN and Post -op Vital signs reviewed and stable  Post vital signs: Reviewed and stable  Last Vitals:  Vitals Value Taken Time  BP 107/51 05/29/19 0900  Temp    Pulse 55 05/29/19 0900  Resp 17 05/29/19 0900  SpO2 100 % 05/29/19 0900  Vitals shown include unvalidated device data.  Last Pain:  Vitals:   05/29/19 0607  TempSrc: Oral         Complications: No apparent anesthesia complications

## 2019-05-29 NOTE — Plan of Care (Signed)

## 2019-05-29 NOTE — Discharge Summary (Signed)
Patient ID: Edward Foster MRN: [id number] DOB/AGE: [birth year] 83 y.o.  Admit date: 05/29/2019 Discharge date: 05/29/2019  Admission Diagnoses:  Active Problems:   Osteoarthritis of right hip   Status post total replacement of right hip   Discharge Diagnoses:  Same  Past Medical History:  Diagnosis Date  . Heart murmur   . HOH (hard of hearing)   . Hypertrophic cardiomyopathy ([place])    Echo 5/18: severe conc LVH, EF 50-55, LVOT gradient not well characterized but peak velocity 2.5 m/sec; mild to mod AS (mean 19, peak 33), mild AI, severe post MAC, mod LAE, PFO cannot be excluded  . Hypothyroidism   . Prostate cancer [facility])     Surgeries: Procedure(s): RIGHT TOTAL HIP ARTHROPLASTY ANTERIOR APPROACH on 05/29/2019   Consultants:   Discharged Condition: Improved  Hospital Course: Edward Foster is an 85 y.o. male who was admitted 05/29/2019 for operative treatment of<principal problem not specified>. Patient has severe unremitting pain that affects sleep, daily activities, and work/hobbies. After pre-op clearance the patient was taken to the operating room on 05/29/2019 and underwent  Procedure(s): RIGHT TOTAL HIP ARTHROPLASTY ANTERIOR APPROACH.    Patient was given perioperative antibiotics:  Anti-infectives (From admission, onward)   Start     Dose/Rate Route Frequency Ordered Stop   05/29/19 0600  ceFAZolin (ANCEF) IVPB 2g/100 mL premix     2 g 200 mL/hr over 30 Minutes Intravenous On call to O.R. 05/29/19 0535 05/29/19 0732   05/29/19 0542  ceFAZolin (ANCEF) 2-4 GM/100ML-% IVPB    Note to Pharmacy: Lavon Paganini   : cabinet override      05/29/19 0542 05/29/19 0717       Patient was given sequential compression devices, early ambulation, and chemoprophylaxis to prevent DVT.  Patient benefited maximally from hospital stay and there were no complications.    Recent vital signs:  Patient Vitals for the past 24 hrs:  BP Temp Temp src Pulse Resp SpO2 Height Weight   05/29/19 1458 (!) 115/45 - - (!) 59 16 97 % - -  05/29/19 1325 (!) 107/47 97.9 F (36.6 C) Oral (!) 56 16 100 % - -  05/29/19 1227 (!) 111/48 98.1 F (36.7 C) Oral (!) 55 16 97 % _0  (1.778 m) 74.5 kg  05/29/19 1122 (!) 160/142 97.6 F (36.4 C) Oral (!) 53 16 98 % - -  05/29/19 1045 (!) 103/44 - - (!) 56 17 100 % - -  05/29/19 1030 (!) 93/48 (!) 97.4 F (36.3 C) - (!) 56 16 96 % - -  05/29/19 1015 (!) 111/44 - - (!) 54 20 99 % - -  05/29/19 1000 (!) 103/46 - - (!) 50 (!) 9 99 % - -  05/29/19 0945 (!) 106/44 - - (!) 51 20 98 % - -  05/29/19 0930 (!) 104/46 - - (!) 54 18 98 % - -  05/29/19 0915 - - - (!) 53 15 100 % - -  05/29/19 0900 (!) 107/51 (!) 97.4 F (36.3 C) - (!) 57 14 100 % - -  05/29/19 1601 (!) 142/61 97.8 F (36.6 C) Oral (!) 52 15 - - -     Recent laboratory studies: No results for input(s): WBC, HGB, HCT, PLT, NA, K, CL, CO2, BUN, CREATININE, GLUCOSE, INR, CALCIUM in the last 72 hours.  Invalid input(s): PT, 2   Discharge Medications:   Allergies as of 05/29/2019   No Known Allergies     Medication List  STOP taking these medications   acetaminophen 500 MG tablet Commonly known as: TYLENOL   ibuprofen 600 MG tablet Commonly known as: ADVIL     TAKE these medications   aspirin EC 81 MG tablet Take 1 tablet (81 mg total) by mouth 2 (two) times daily.   bisoprolol 5 MG tablet Commonly known as: ZEBETA Take 0.5 tablets (2.5 mg total) by mouth daily.   CALCIUM 600 PO Take 600 mg by mouth daily.   ipratropium 0.06 % nasal spray Commonly known as: ATROVENT Place 1 spray into both nostrils daily as needed for rhinitis.   levothyroxine 125 MCG tablet Commonly known as: SYNTHROID Take 125 mcg by mouth daily.   Ocuvite Adult 50+ Caps Take 1 capsule by mouth daily.   oxyCODONE-acetaminophen 5-325 MG tablet Commonly known as: PERCOCET/ROXICET Take 1 tablet by mouth every 4 (four) hours as needed for severe pain.   tiZANidine 2 MG  tablet Commonly known as: ZANAFLEX Take 1 tablet (2 mg total) by mouth every 6 (six) hours as needed.            Durable Medical Equipment  (From admission, onward)         Start     Ordered   05/29/19 1149  DME Walker rolling  Once    Question:  Patient needs a walker to treat with the following condition  Answer:  Status post right hip replacement   05/29/19 1148   05/29/19 1149  DME 3 n 1  Once     05/29/19 1148           Discharge Care Instructions  (From admission, onward)         Start     Ordered   05/29/19 0000  Weight bearing as tolerated     05/29/19 1704          Diagnostic Studies: Dg Chest 2 View  Result Date: 05/29/2019 CLINICAL DATA:  Preop for prostate cancer. EXAM: CHEST - 2 VIEW COMPARISON:  May 25, 2019. FINDINGS: The heart size and mediastinal contours are within normal limits. Both lungs are clear. Nipple markers correspond to nodular density seen on prior radiograph, consistent with overlying nipple shadow. The visualized skeletal structures are unremarkable. IMPRESSION: No active cardiopulmonary disease. Nipple markers correspond to nodular density seen on prior radiograph, consistent with overlying nipple shadow. Electronically Signed   By: Marijo Conception M.D.   On: 05/29/2019 07:44   Dg Chest 2 View  Result Date: 05/25/2019 CLINICAL DATA:  Preoperative evaluation, prostate cancer, hypertrophic cardiomyopathy, former smoker EXAM: CHEST - 2 VIEW COMPARISON:  None FINDINGS: Upper normal heart size. Mediastinal contours and pulmonary vascularity normal. Question BILATERAL nipple shadows. Minimal atelectasis or scarring at LEFT lung base. Lungs otherwise clear. No pleural effusion or pneumothorax. Bones demineralized. IMPRESSION: Minimal atelectasis or scarring at LEFT base. Question BILATERAL nipple shadow; repeat PA chest radiograph with nipple markers recommended to exclude pulmonary nodules. These results will be called to the ordering  clinician or representative by the Radiologist Assistant, and communication documented in the PACS or zVision Dashboard. Electronically Signed   By: Lavonia Dana M.D.   On: 05/25/2019 18:13   Dg C-arm 1-60 Min-no Report  Result Date: 05/29/2019 Fluoroscopy was utilized by the requesting physician.  No radiographic interpretation.   Dg Hip Operative Unilat W Or W/o Pelvis Right  Result Date: 05/29/2019 CLINICAL DATA:  85 year old male with right hip replacement. EXAM: OPERATIVE right HIP (WITH PELVIS IF PERFORMED) 1 VIEW TECHNIQUE:  Fluoroscopic spot image(s) were submitted for interpretation post-operatively. COMPARISON:  None. FINDINGS: Two fluoroscopic spot images frontal view of the right hip provided. The fluoroscopy time is 9 seconds. There is a total right hip arthroplasty which appears intact and in anatomic alignment on the provided images. Prostate brachytherapy seeds noted. IMPRESSION: Status post right hip arthroplasty. Electronically Signed   By: Anner Crete M.D.   On: 05/29/2019 08:39    Disposition: Discharge disposition: 01-Home or Self Care       Discharge Instructions    Call MD / Call 911   Complete by: As directed    If you experience chest pain or shortness of breath, CALL 911 and be transported to the hospital emergency room.  If you develope a fever above 101 F, pus (white drainage) or increased drainage or redness at the wound, or calf pain, call your surgeon's office.   Constipation Prevention   Complete by: As directed    Drink plenty of fluids.  Prune juice may be helpful.  You may use a stool softener, such as Colace (over the counter) 100 mg twice a day.  Use MiraLax (over the counter) for constipation as needed.   Diet - low sodium heart healthy   Complete by: As directed    Driving restrictions   Complete by: As directed    No driving for 2 weeks   Increase activity slowly as tolerated   Complete by: As directed    Patient may shower   Complete by:  As directed    You may shower without a dressing once there is no drainage.  Do not wash over the wound.  If drainage remains, cover wound with plastic wrap and then shower.   Weight bearing as tolerated   Complete by: As directed       Follow-up Information    Frederik Pear, MD In 2 weeks.   Specialty: Orthopedic Surgery Contact information: [place]  [address] [phone]            Signed: Joanell Rising 05/29/2019, 5:04 PM

## 2019-05-29 NOTE — Anesthesia Postprocedure Evaluation (Signed)
Anesthesia Post Note  Patient: Edward Foster  Procedure(s) Performed: RIGHT TOTAL HIP ARTHROPLASTY ANTERIOR APPROACH (Right Hip)     Patient location during evaluation: PACU Anesthesia Type: Spinal Level of consciousness: awake and alert Pain management: pain level controlled Vital Signs Assessment: post-procedure vital signs reviewed and stable Respiratory status: spontaneous breathing, nonlabored ventilation, respiratory function stable and patient connected to nasal cannula oxygen Cardiovascular status: blood pressure returned to baseline and stable Postop Assessment: no apparent nausea or vomiting Anesthetic complications: no    Last Vitals:  Vitals:   05/29/19 1325 05/29/19 1458  BP: (!) 107/47 (!) 115/45  Pulse: (!) 56 (!) 59  Resp: 16 16  Temp: 36.6 C   SpO2: 100% 97%    Last Pain:  Vitals:   05/29/19 1325  TempSrc: Oral  PainSc:                  Temiloluwa Recchia

## 2019-05-29 NOTE — Evaluation (Signed)
Physical Therapy Evaluation Patient Details Name: Edward Foster MRN: [id number] DOB: 10-Jan-1923 Today's Date: 05/29/2019   History of Present Illness  s/p R DA THA  Clinical Impression  Pt is s/p THA resulting in the deficits listed below (see PT Problem List).  Pt doing very well, amb ~ 100' with RW and min-guard to supervision. Will provide HEP, pt needs RW and 3in1, set up for HHPT. Has family support for up to 2 wks if needed.   Pt will benefit from skilled PT to increase their independence and safety with mobility to allow discharge to the venue listed below.      Follow Up Recommendations Home health PT;Supervision for mobility/OOB    Equipment Recommendations  Rolling walker with 5" wheels;3in1 (PT)    Recommendations for Other Services       Precautions / Restrictions Precautions Precautions: Fall Restrictions Weight Bearing Restrictions: No Other Position/Activity Restrictions: WBAT      Mobility  Bed Mobility Overal bed mobility: Needs Assistance Bed Mobility: Supine to Sit     Supine to sit: Supervision     General bed mobility comments: for safety and lines  Transfers Overall transfer level: Needs assistance Equipment used: Rolling walker (2 wheeled) Transfers: Sit to/from Stand Sit to Stand: Min guard;Supervision         General transfer comment: cues for hand placement and overall safety  Ambulation/Gait Ambulation/Gait assistance: Min guard;Supervision Gait Distance (Feet): 100 Feet Assistive device: Rolling walker (2 wheeled) Gait Pattern/deviations: Step-to pattern;Step-through pattern;Decreased stride length     General Gait Details: cues for RW position, safety, posture. no LOB with distance above. no drifting noted  Stairs            Wheelchair Mobility    Modified Rankin (Stroke Patients Only)       Balance Overall balance assessment: Needs assistance Sitting-balance support: Feet supported;No upper extremity  supported Sitting balance-Leahy Scale: Good       Standing balance-Leahy Scale: Fair                               Pertinent Vitals/Pain Pain Assessment: 0-10 Pain Score: 0-No pain Pain Location: right hip Pain Intervention(s): Limited activity within patient's tolerance;Monitored during session;Repositioned;RN gave pain meds during session    [place] expects to be discharged to:: Private residence Living Arrangements: Alone Available Help at Discharge: Family;Available 24 hours/day(for 2 wks if needed) Type of Home: House Home Access: Level entry     Home Layout: One level Home Equipment: None      Prior Function Level of Independence: Independent               Hand Dominance        Extremity/Trunk Assessment   Upper Extremity Assessment Upper Extremity Assessment: Overall WFL for tasks assessed    Lower Extremity Assessment Lower Extremity Assessment: RLE deficits/detail RLE Deficits / Details: ankle WFL, knee and hip grossly 3/5       Communication   Communication: No difficulties  Cognition Arousal/Alertness: Awake/alert Behavior During Therapy: WFL for tasks assessed/performed Overall Cognitive Status: Within Functional Limits for tasks assessed                                        General Comments      Exercises Total Joint Exercises Ankle Circles/Pumps: AROM;Both;10 reps Quad Sets:  10 reps;Both;AROM   Assessment/Plan    PT Assessment Patient needs continued PT services  PT Problem List Decreased strength;Decreased range of motion;Decreased activity tolerance;Pain;Decreased knowledge of use of DME;Decreased mobility       PT Treatment Interventions DME instruction;Therapeutic exercise;Gait training;Functional mobility training;Therapeutic activities;Patient/family education    PT Goals (Current goals can be found in the Care Plan section)  Acute Rehab PT Goals Patient Stated Goal:  home today PT Goal Formulation: With patient Time For Goal Achievement: 06/05/19 Potential to Achieve Goals: Good    Frequency 7X/week   Barriers to discharge        Co-evaluation               AM-PAC PT "6 Clicks" Mobility  Outcome Measure Help needed turning from your back to your side while in a flat bed without using bedrails?: None Help needed moving from lying on your back to sitting on the side of a flat bed without using bedrails?: None Help needed moving to and from a bed to a chair (including a wheelchair)?: A Little Help needed standing up from a chair using your arms (e.g., wheelchair or bedside chair)?: A Little Help needed to walk in hospital room?: A Little Help needed climbing 3-5 steps with a railing? : A Little 6 Click Score: 20    End of Session Equipment Utilized During Treatment: Gait belt Activity Tolerance: Patient tolerated treatment well Patient left: with call bell/phone within reach;in chair;with chair alarm set;with family/visitor present   PT Visit Diagnosis: Difficulty in walking, not elsewhere classified (R26.2)    Time: [id number] PT Time Calculation (min) (ACUTE ONLY): 29 min   Charges:   PT Evaluation $PT Eval Low Complexity: 1 Low PT Treatments $Gait Training: 8-22 mins        Kenyon Ana, PT  Pager: [phone] Acute Rehab Dept [facility]): [id number]   05/29/2019   [facility] 05/29/2019, 2:58 PM

## 2019-05-29 NOTE — Anesthesia Procedure Notes (Signed)
Date/Time: 05/29/2019 7:23 AM Performed by: Talbot Grumbling, CRNA Oxygen Delivery Method: Simple face mask

## 2019-05-29 NOTE — Discharge Instructions (Signed)

## 2019-05-29 NOTE — Anesthesia Procedure Notes (Signed)
Spinal  Patient location during procedure: OR Start time: 05/29/2019 7:21 AM End time: 05/29/2019 9:24 AM Staffing Anesthesiologist: Janeece Riggers, MD Preanesthetic Checklist Completed: patient identified, site marked, surgical consent, pre-op evaluation, timeout performed, IV checked, risks and benefits discussed and monitors and equipment checked Spinal Block Patient position: sitting Prep: DuraPrep Patient monitoring: heart rate, cardiac monitor, continuous pulse ox and blood pressure Approach: midline Location: L4-5 Injection technique: single-shot Needle Needle type: Sprotte  Needle gauge: 24 G Needle length: 9 cm Assessment Sensory level: T4

## 2019-05-29 NOTE — TOC Transition Note (Signed)
Transition of Care [facility]) - CM/SW Discharge Note   Patient Details  Name: Edward Foster MRN: [id number] Date of Birth: Feb 08, 1923  Transition of [facility]) CM/SW Contact:  Lia Hopping, [place] Phone Number: 05/29/2019, 3:58 PM   Clinical Narrative:    DME ordered through [place]. Patient and daughter decline [place] services. Daughter prefers the patient continue his home exercise and follow up with outpatient therapy. She will reach out to orthopedic office in the am to determine the patient options. DME ordered and delivered to the patient room.    Final next level of care: Home w Home Health Services Barriers to Discharge: No Barriers Identified   Patient Goals and CMS Choice   CMS Medicare.gov Compare Post Acute Care list provided to:: Patient Choice offered to / list presented to : Patient  Discharge Placement                  Name of family member notified: Daughter Bedside. Patient and family notified of of transfer: 05/29/19  Discharge Plan and Services                DME Arranged: 3-N-1, Walker rolling DME Agency: [employer] Date DME Agency Contacted: 05/29/19 Time DME Agency Contacted: [phone] Representative spoke with at DME Agency: [facility] HH Arranged: ([place].)          Social Determinants of Health (SDOH) Interventions     Readmission Risk Interventions No flowsheet data found.

## 2019-05-30 ENCOUNTER — Encounter (HOSPITAL_COMMUNITY): Payer: Self-pay | Admitting: Orthopedic Surgery

## 2019-06-08 DIAGNOSIS — Z20828 Contact with and (suspected) exposure to other viral communicable diseases: Secondary | ICD-10-CM | POA: Diagnosis not present

## 2019-06-13 DIAGNOSIS — M1611 Unilateral primary osteoarthritis, right hip: Secondary | ICD-10-CM | POA: Diagnosis not present

## 2019-07-18 DIAGNOSIS — Z96641 Presence of right artificial hip joint: Secondary | ICD-10-CM | POA: Diagnosis not present

## 2019-07-18 DIAGNOSIS — Z471 Aftercare following joint replacement surgery: Secondary | ICD-10-CM | POA: Diagnosis not present

## 2019-08-04 ENCOUNTER — Ambulatory Visit: Payer: PPO

## 2019-08-09 ENCOUNTER — Ambulatory Visit: Payer: PPO

## 2019-08-12 ENCOUNTER — Ambulatory Visit: Payer: PPO | Attending: Internal Medicine

## 2019-08-12 DIAGNOSIS — Z23 Encounter for immunization: Secondary | ICD-10-CM | POA: Insufficient documentation

## 2019-08-12 NOTE — Progress Notes (Signed)
   Covid-19 Vaccination Clinic  Name:  Edward Foster    MRN: [id number] DOB: [birth year]  08/12/2019  [name] was observed post Covid-19 immunization for 15 minutes without incidence. He was provided with Vaccine Information Sheet and instruction to access the V-Safe system.   [name] was instructed to call 911 with any severe reactions post vaccine: Marland Kitchen Difficulty breathing  . Swelling of your face and throat  . A fast heartbeat  . A bad rash all over your body  . Dizziness and weakness    Immunizations Administered    Name Date Dose VIS Date Route   Pfizer COVID-19 Vaccine 08/12/2019  4:04 PM 0.3 mL 06/16/2019 Intramuscular   Manufacturer: [place]   Lot: [id number]   [place]: [id number]

## 2019-09-05 DIAGNOSIS — E781 Pure hyperglyceridemia: Secondary | ICD-10-CM | POA: Diagnosis not present

## 2019-09-05 DIAGNOSIS — M859 Disorder of bone density and structure, unspecified: Secondary | ICD-10-CM | POA: Diagnosis not present

## 2019-09-05 DIAGNOSIS — E038 Other specified hypothyroidism: Secondary | ICD-10-CM | POA: Diagnosis not present

## 2019-09-06 ENCOUNTER — Ambulatory Visit: Payer: PPO | Attending: Internal Medicine

## 2019-09-06 DIAGNOSIS — Z23 Encounter for immunization: Secondary | ICD-10-CM | POA: Insufficient documentation

## 2019-09-06 NOTE — Progress Notes (Signed)
   Covid-19 Vaccination Clinic  Name:  Edward Foster    MRN: [id number] DOB: Jul 25, 1922  09/06/2019  [name] was observed post Covid-19 immunization for 15 minutes without incident. He was provided with Vaccine Information Sheet and instruction to access the V-Safe system.   [name] was instructed to call 911 with any severe reactions post vaccine: Marland Kitchen Difficulty breathing  . Swelling of face and throat  . A fast heartbeat  . A bad rash all over body  . Dizziness and weakness   Immunizations Administered    Name Date Dose VIS Date Route   Pfizer COVID-19 Vaccine 09/06/2019 12:13 PM 0.3 mL 06/16/2019 Intramuscular   Manufacturer: [place]   Lot: [id number]   [place]: [id number]

## 2019-09-12 DIAGNOSIS — R82998 Other abnormal findings in urine: Secondary | ICD-10-CM | POA: Diagnosis not present

## 2019-09-12 DIAGNOSIS — E039 Hypothyroidism, unspecified: Secondary | ICD-10-CM | POA: Diagnosis not present

## 2019-09-12 DIAGNOSIS — Z8546 Personal history of malignant neoplasm of prostate: Secondary | ICD-10-CM | POA: Diagnosis not present

## 2019-09-12 DIAGNOSIS — N400 Enlarged prostate without lower urinary tract symptoms: Secondary | ICD-10-CM | POA: Diagnosis not present

## 2019-09-12 DIAGNOSIS — R32 Unspecified urinary incontinence: Secondary | ICD-10-CM | POA: Diagnosis not present

## 2019-09-12 DIAGNOSIS — I421 Obstructive hypertrophic cardiomyopathy: Secondary | ICD-10-CM | POA: Diagnosis not present

## 2019-09-12 DIAGNOSIS — N1831 Chronic kidney disease, stage 3a: Secondary | ICD-10-CM | POA: Diagnosis not present

## 2019-09-12 DIAGNOSIS — I129 Hypertensive chronic kidney disease with stage 1 through stage 4 chronic kidney disease, or unspecified chronic kidney disease: Secondary | ICD-10-CM | POA: Diagnosis not present

## 2019-09-12 DIAGNOSIS — M353 Polymyalgia rheumatica: Secondary | ICD-10-CM | POA: Diagnosis not present

## 2019-09-12 DIAGNOSIS — M545 Low back pain: Secondary | ICD-10-CM | POA: Diagnosis not present

## 2019-09-12 DIAGNOSIS — H3530 Unspecified macular degeneration: Secondary | ICD-10-CM | POA: Diagnosis not present

## 2019-09-12 DIAGNOSIS — Z1331 Encounter for screening for depression: Secondary | ICD-10-CM | POA: Diagnosis not present

## 2019-09-12 DIAGNOSIS — Z0000 Encounter for general adult medical examination without abnormal findings: Secondary | ICD-10-CM | POA: Diagnosis not present

## 2020-01-03 ENCOUNTER — Other Ambulatory Visit: Payer: Self-pay

## 2020-01-03 ENCOUNTER — Encounter: Payer: Self-pay | Admitting: Cardiovascular Disease

## 2020-01-03 ENCOUNTER — Ambulatory Visit: Payer: PPO | Admitting: Cardiovascular Disease

## 2020-01-03 VITALS — BP 112/60 | HR 50 | Ht 70.0 in | Wt 165.0 lb

## 2020-01-03 DIAGNOSIS — I421 Obstructive hypertrophic cardiomyopathy: Secondary | ICD-10-CM

## 2020-01-03 NOTE — Patient Instructions (Signed)

## 2020-01-03 NOTE — Progress Notes (Signed)
Chief Complaint  Patient presents with  . Follow-up    HOCM     History of Present Illness: 86 yo male (last name is pronounced Juh-leen) with history of prostate cancer s/p brachytherapy and XRT, hypertrophic cardiomyopathy, hypothyroidism who is here today for cardiac follow up. I saw him as a new patient January 2014. He has been followed by Dr. Cory Roughen ([facility] [place]) for idiopathic sub-aortic stenosis. No prior CAD or MI. He moved from [place] in June 2013 to be near his daughter in [place] and son in [place]. He was seen in our office 11/04/16 after a near syncopal event after starting a herbal supplement for his bladder. This medication was stopped due to anticholinergic side effects. Echo May 2018 with normal LV function, severe concentric LVH, mild AS, overall unchanged.  24 hour cardiac monitor with sinus rhythm, PVCs, no long pauses. He had a syncopal episode in February 2020. He was seen in our office August 2020 by Richardson Dopp, PA-C and his beta blocker dose was decreased. Cardiac monitor with several quick bursts of SVT but no pauses or heart block noted.   He is here today for follow up. The patient denies any chest pain, dyspnea, palpitations, lower extremity edema, orthopnea, PND, dizziness, near syncope or syncope. He is feeling great.   Primary Care Physician: Velna Hatchet, MD  Past Medical History:  Diagnosis Date  . Heart murmur   . HOH (hard of hearing)   . Hypertrophic cardiomyopathy ([place])    Echo 5/18: severe conc LVH, EF 50-55, LVOT gradient not well characterized but peak velocity 2.5 m/sec; mild to mod AS (mean 19, peak 33), mild AI, severe post MAC, mod LAE, PFO cannot be excluded  . Hypothyroidism   . Prostate cancer [facility])     Past Surgical History:  Procedure Laterality Date  . SEED IMPLANTS - PROSTATE    . TOTAL HIP ARTHROPLASTY Right 05/29/2019   Procedure: RIGHT TOTAL HIP ARTHROPLASTY ANTERIOR APPROACH;  Surgeon: Frederik Pear, MD;  Location: [facility]  [facility];  Service: Orthopedics;  Laterality: Right;    Current Outpatient Medications  Medication Sig Dispense Refill  . bisoprolol (ZEBETA) 5 MG tablet Take 0.5 tablets (2.5 mg total) by mouth daily. 45 tablet 1  . Calcium Carbonate (CALCIUM 600 PO) Take 600 mg by mouth daily.     Marland Kitchen ipratropium (ATROVENT) 0.06 % nasal spray Place 1 spray into both nostrils daily as needed for rhinitis.   2  . levothyroxine (SYNTHROID, LEVOTHROID) 125 MCG tablet Take 125 mcg by mouth daily.    . Multiple Vitamins-Minerals (OCUVITE ADULT 50+) CAPS Take 1 capsule by mouth daily.     No current facility-administered medications for this visit.    No Known Allergies  Social History   Socioeconomic History  . Marital status: Widowed    Spouse name: Not on file  . Number of children: 4  . Years of education: Not on file  . Highest education level: Not on file  Occupational History  . Occupation: Retired  Tobacco Use  . Smoking status: Former Smoker    Years: 20.00    Quit date: 07/19/1973    Years since quitting: 46.4  . Smokeless tobacco: Former Network engineer  . Vaping Use: Never used  Substance and Sexual Activity  . Alcohol use: Yes    Alcohol/week: 8.0 standard drinks    Types: 7 Standard drinks or equivalent, 1 Shots of liquor per week  . Drug use: No  .  Sexual activity: Not on file  Other Topics Concern  . Not on file  Social History Narrative  . Not on file   Social Determinants of Health   Financial Resource Strain:   . Difficulty of Paying Living Expenses:   Food Insecurity:   . Worried About Charity fundraiser in the Last Year:   . Arboriculturist in the Last Year:   Transportation Needs:   . Film/video editor (Medical):   Marland Kitchen Lack of Transportation (Non-Medical):   Physical Activity:   . Days of Exercise per Week:   . Minutes of Exercise per Session:   Stress:   . Feeling of Stress :   Social Connections:   . Frequency of Communication with Friends and Family:   .  Frequency of Social Gatherings with Friends and Family:   . Attends Religious Services:   . Active Member of Clubs or Organizations:   . Attends Archivist Meetings:   Marland Kitchen Marital Status:   Intimate Partner Violence:   . Fear of Current or Ex-Partner:   . Emotionally Abused:   Marland Kitchen Physically Abused:   . Sexually Abused:     Family History  Problem Relation Age of Onset  . Diabetes Mother     Review of Systems:  As stated in the HPI and otherwise negative.   BP 112/60   Pulse (!) 50   Ht _0  (1.778 m)   Wt 165 lb (74.8 kg)   SpO2 97%   BMI 23.68 kg/m   Physical Examination:  General: Well developed, well nourished, NAD  HEENT: OP clear, mucus membranes moist  SKIN: warm, dry. No rashes. Neuro: No focal deficits  Musculoskeletal: Muscle strength 5/5 all ext  Psychiatric: Mood and affect normal  Neck: No JVD, no carotid bruits, no thyromegaly, no lymphadenopathy.  Lungs:Clear bilaterally, no wheezes, rhonci, crackles Cardiovascular: Regular rate and rhythm. No murmurs, gallops or rubs. Abdomen:Soft. Bowel sounds present. Non-tender.  Extremities: No lower extremity edema. Pulses are 2 + in the bilateral DP/PT.  Echo May 2018: Left ventricle: SAM LVOT gradient not well characterized but peak   velocity in the 2.5 m/sec range. There was severe concentric   hypertrophy. Systolic function was normal. The estimated ejection   fraction was in the range of 50% to 55%. Doppler parameters are   consistent with abnormal left ventricular relaxation (grade 1   diastolic dysfunction). - Aortic valve: There was mild to moderate stenosis. There was mild   regurgitation. Valve area (VTI): 2.3 cm^2. Valve area (Vmax):   2.61 cm^2. Valve area (Vmean): 2.59 cm^2. - Mitral valve: Severe posterior annular calcification. Valve area   by pressure half-time: 2.5 cm^2. Valve area by continuity   equation (using LVOT flow): 3.73 cm^2. - Left atrium: The atrium was moderately  dilated. - Atrial septum: A patent foramen ovale cannot be excluded.  EKG:  EKG is  Not ordered today. The ekg ordered today demonstrates   Recent Labs: 05/25/2019: BUN 22; Creatinine, Ser 1.30; Hemoglobin 13.1; Platelets 177; Potassium 4.6; Sodium 142   Lipid Panel No results found for: CHOL, TRIG, HDL, CHOLHDL, VLDL, LDLCALC, LDLDIRECT   Wt Readings from Last 3 Encounters:  01/03/20 165 lb (74.8 kg)  05/29/19 164 lb 3.9 oz (74.5 kg)  05/25/19 166 lb 2 oz (75.4 kg)     Other studies Reviewed: Additional studies/ records that were reviewed today include: . Review of the above records demonstrates:   Assessment and Plan:  1. Hypertrophic Cardiomyopathy: Echo May 2018 with findings of HOCM, normal LV systolic function, unchanged. He has had previous workup by a HOCM specialist in [place]. He is doing well . Will continue beta blocker. He does not wish to repeat an echo right now.   2. LBBB: Chronic  Current medicines are reviewed at length with the patient today.  The patient does not have concerns regarding medicines.  The following changes have been made:  no change  Labs/ tests ordered today include:   No orders of the defined types were placed in this encounter.   Disposition:   FU with me in 12  months  Signed, Lauree Chandler, MD 01/03/2020 12:12 PM    [address], [place], [place]  [address] Phone: [phone]; Fax: [phone]

## 2020-02-25 DIAGNOSIS — Z23 Encounter for immunization: Secondary | ICD-10-CM | POA: Diagnosis not present

## 2020-03-19 DIAGNOSIS — H353132 Nonexudative age-related macular degeneration, bilateral, intermediate dry stage: Secondary | ICD-10-CM | POA: Diagnosis not present

## 2020-05-18 IMAGING — DX DG SHOULDER 2+V*L*
4 series · 4 of 4 positions shown · non-contrast
Comparison: None.

CLINICAL DATA: Proximal left humerus pain after a fall tonight.

EXAM:
LEFT SHOULDER - 2+ VIEW

[shoulder grashey]
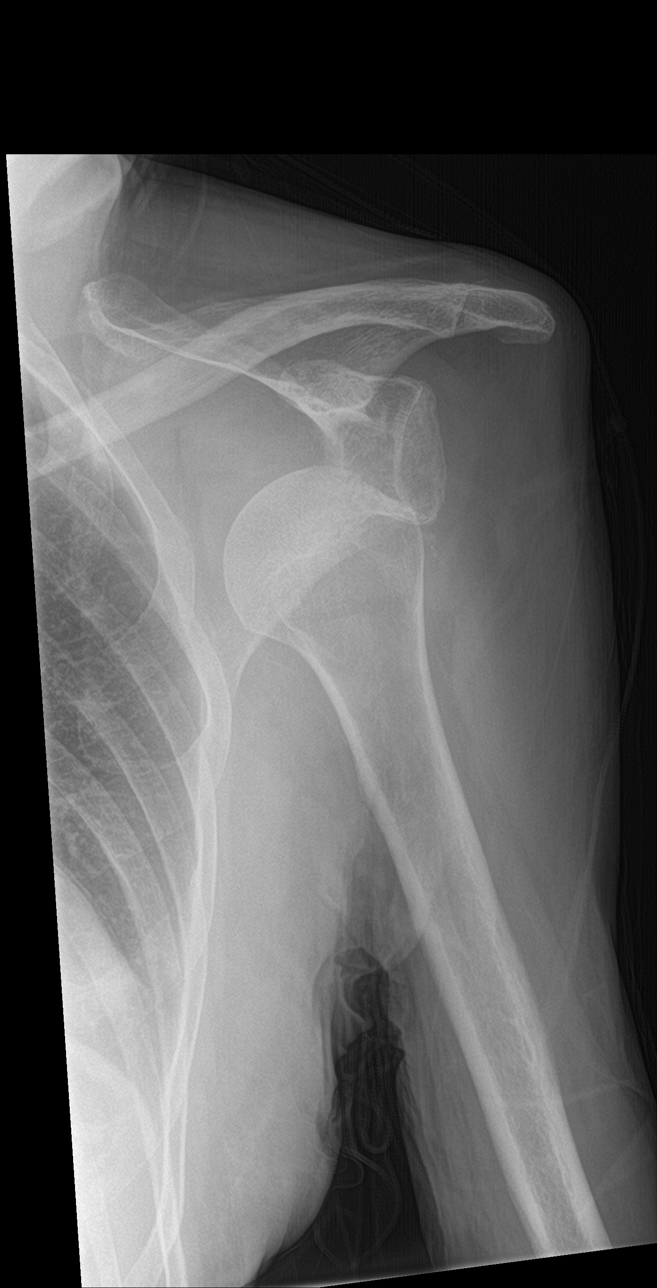

[shoulder y view]
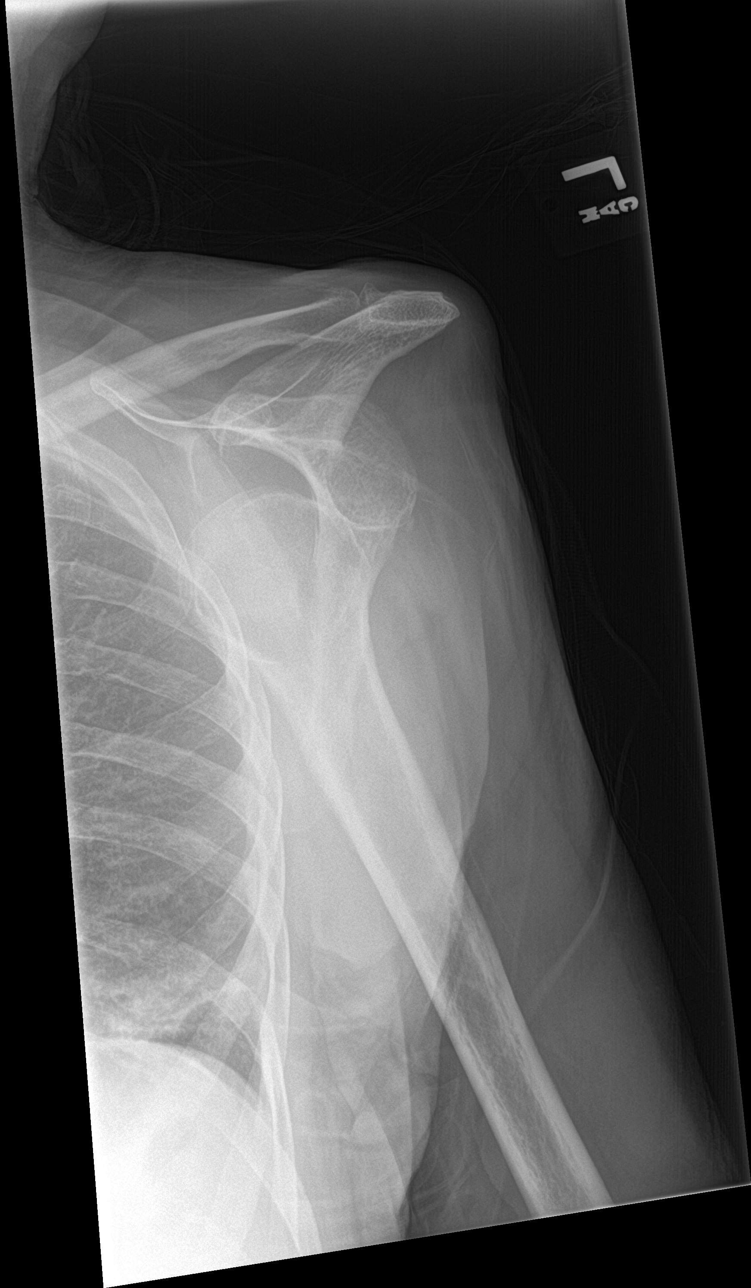

[shoulder axillary]
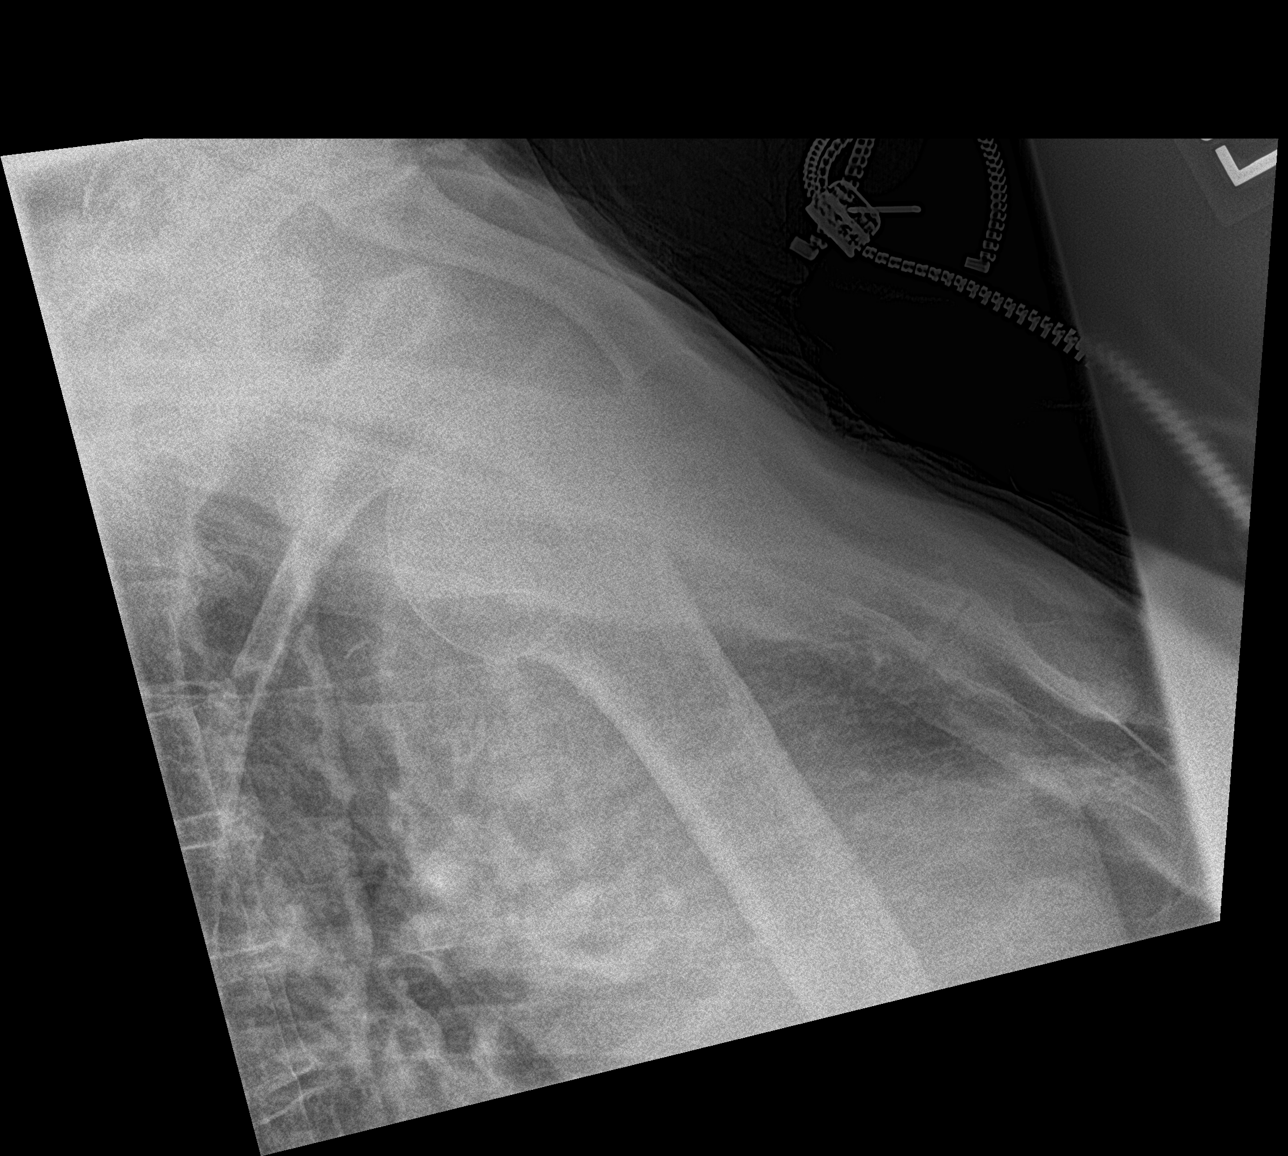

[shoulder ap neutral]
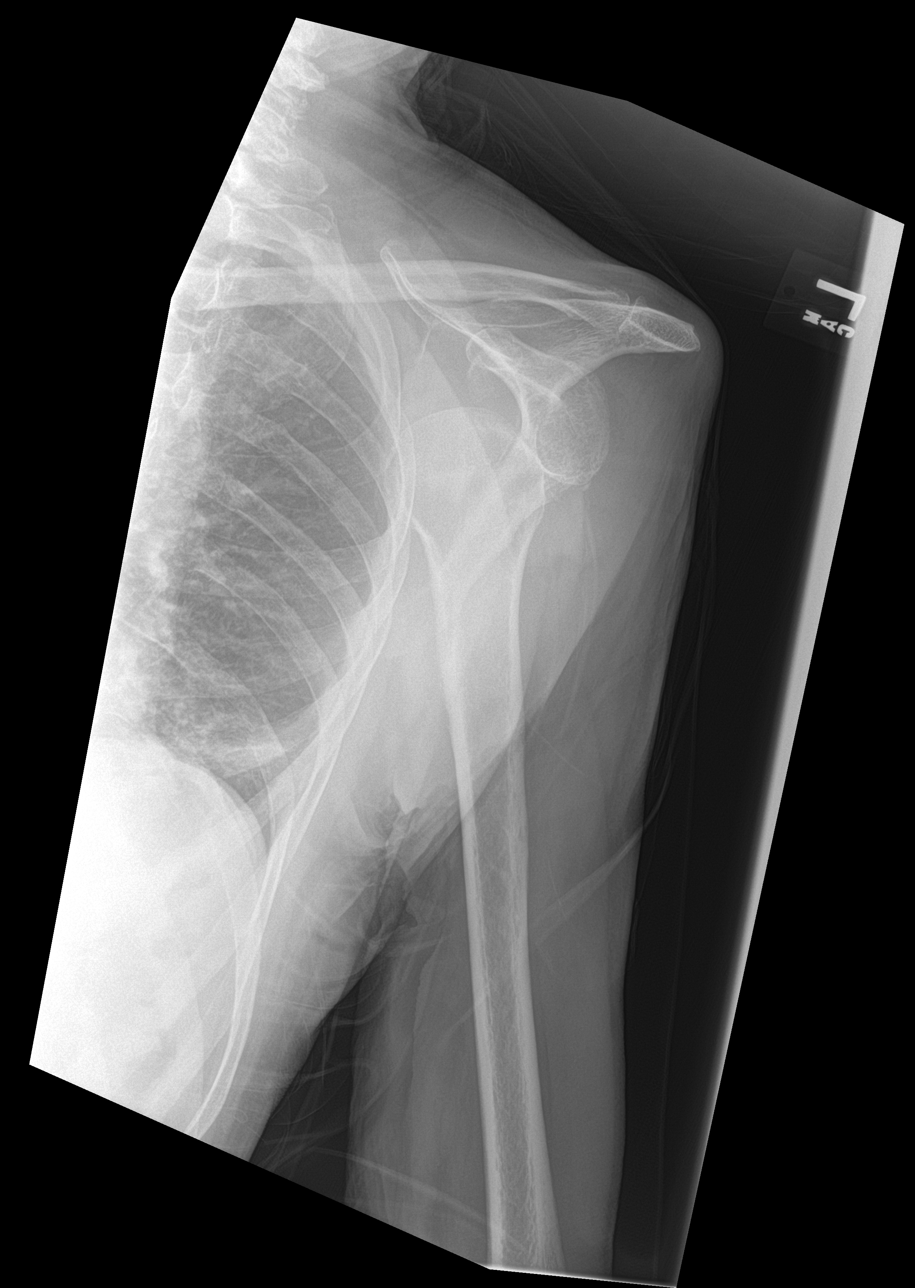

[4 of 4 positions shown; findings below may reference images not displayed]

FINDINGS: Anterior dislocation of the left shoulder. There is anterior
inferior position of the humeral head with respect to the glenoid.
Tiny osseous fragments inferior to the glenoid may represent Seed
Irfan Sandra fractures. Probable impaction of the lateral humeral head on to
the inferior glenoid. Coracoclavicular and acromioclavicular spaces
are maintained. Soft tissues are unremarkable.
IMPRESSION: Anterior dislocation of the left shoulder. Possible Bankart
fracture.

## 2020-05-19 IMAGING — DX DG SHOULDER 2+V*L*
1 series · 1 of 1 positions shown · non-contrast
Comparison: 08/06/2018

CLINICAL DATA: Postreduction left shoulder dislocation.

EXAM:
LEFT SHOULDER - 2+ VIEW

[shoulder ap]
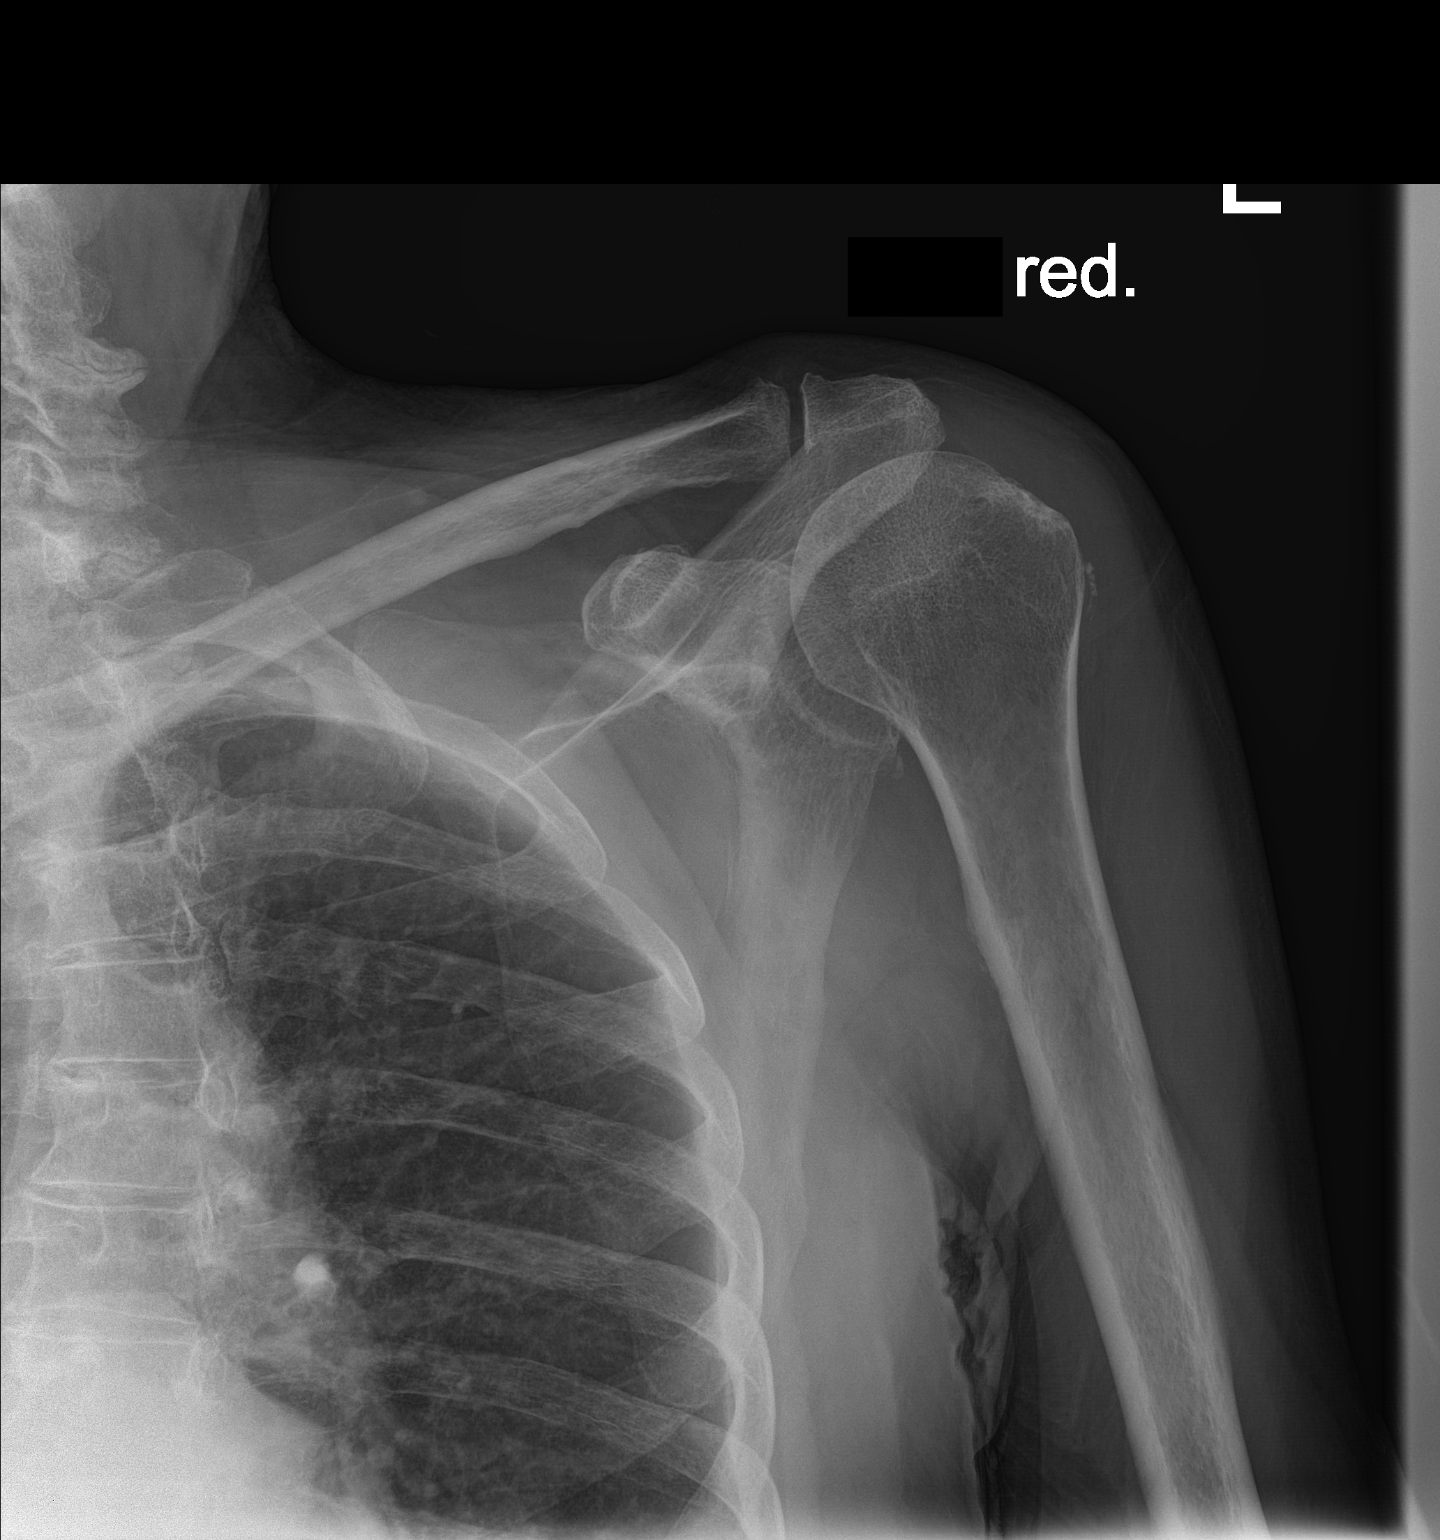

[1 of 1 positions shown; findings below may reference images not displayed]

FINDINGS: Left shoulder demonstrates anatomic position on single view
consistent with successful reduction of previous anterior
dislocation. Small osseous fragment inferior to the glenoid likely
represents a Bankart lesion. Calcification over the lateral humeral
head may represent ligamentous calcification or calcific tendinosis.
Acromioclavicular and coracoclavicular spaces are maintained.
IMPRESSION: Anatomic position of the left shoulder postreduction. Small osseous
fragment inferior to the glenoid likely represents a Bankart lesion.

## 2020-09-10 DIAGNOSIS — M859 Disorder of bone density and structure, unspecified: Secondary | ICD-10-CM | POA: Diagnosis not present

## 2020-09-10 DIAGNOSIS — I10 Essential (primary) hypertension: Secondary | ICD-10-CM | POA: Diagnosis not present

## 2020-09-10 DIAGNOSIS — E039 Hypothyroidism, unspecified: Secondary | ICD-10-CM | POA: Diagnosis not present

## 2020-09-17 DIAGNOSIS — M353 Polymyalgia rheumatica: Secondary | ICD-10-CM | POA: Diagnosis not present

## 2020-09-17 DIAGNOSIS — J309 Allergic rhinitis, unspecified: Secondary | ICD-10-CM | POA: Diagnosis not present

## 2020-09-17 DIAGNOSIS — Z1331 Encounter for screening for depression: Secondary | ICD-10-CM | POA: Diagnosis not present

## 2020-09-17 DIAGNOSIS — E039 Hypothyroidism, unspecified: Secondary | ICD-10-CM | POA: Diagnosis not present

## 2020-09-17 DIAGNOSIS — N1831 Chronic kidney disease, stage 3a: Secondary | ICD-10-CM | POA: Diagnosis not present

## 2020-09-17 DIAGNOSIS — R82998 Other abnormal findings in urine: Secondary | ICD-10-CM | POA: Diagnosis not present

## 2020-09-17 DIAGNOSIS — Z0000 Encounter for general adult medical examination without abnormal findings: Secondary | ICD-10-CM | POA: Diagnosis not present

## 2020-09-17 DIAGNOSIS — I421 Obstructive hypertrophic cardiomyopathy: Secondary | ICD-10-CM | POA: Diagnosis not present

## 2020-09-17 DIAGNOSIS — Z1339 Encounter for screening examination for other mental health and behavioral disorders: Secondary | ICD-10-CM | POA: Diagnosis not present

## 2020-09-17 DIAGNOSIS — R350 Frequency of micturition: Secondary | ICD-10-CM | POA: Diagnosis not present

## 2020-09-17 DIAGNOSIS — I10 Essential (primary) hypertension: Secondary | ICD-10-CM | POA: Diagnosis not present

## 2020-10-03 ENCOUNTER — Emergency Department (HOSPITAL_COMMUNITY): Payer: PPO

## 2020-10-03 ENCOUNTER — Other Ambulatory Visit: Payer: Self-pay

## 2020-10-03 ENCOUNTER — Inpatient Hospital Stay (HOSPITAL_COMMUNITY)
Admission: EM | Admit: 2020-10-03 | Discharge: 2020-10-05 | DRG: 640 | Disposition: A | Discharge status: Home Health | Payer: PPO | Attending: Internal Medicine | Admitting: Internal Medicine

## 2020-10-03 ENCOUNTER — Encounter (HOSPITAL_COMMUNITY): Payer: Self-pay

## 2020-10-03 DIAGNOSIS — Z20822 Contact with and (suspected) exposure to covid-19: Secondary | ICD-10-CM | POA: Diagnosis present

## 2020-10-03 DIAGNOSIS — R1310 Dysphagia, unspecified: Secondary | ICD-10-CM | POA: Diagnosis present

## 2020-10-03 DIAGNOSIS — R32 Unspecified urinary incontinence: Secondary | ICD-10-CM | POA: Diagnosis present

## 2020-10-03 DIAGNOSIS — I6782 Cerebral ischemia: Secondary | ICD-10-CM | POA: Diagnosis not present

## 2020-10-03 DIAGNOSIS — I447 Left bundle-branch block, unspecified: Secondary | ICD-10-CM | POA: Diagnosis present

## 2020-10-03 DIAGNOSIS — I440 Atrioventricular block, first degree: Secondary | ICD-10-CM | POA: Diagnosis present

## 2020-10-03 DIAGNOSIS — R0602 Shortness of breath: Secondary | ICD-10-CM | POA: Diagnosis not present

## 2020-10-03 DIAGNOSIS — R7989 Other specified abnormal findings of blood chemistry: Secondary | ICD-10-CM | POA: Diagnosis present

## 2020-10-03 DIAGNOSIS — R778 Other specified abnormalities of plasma proteins: Secondary | ICD-10-CM

## 2020-10-03 DIAGNOSIS — G319 Degenerative disease of nervous system, unspecified: Secondary | ICD-10-CM | POA: Diagnosis not present

## 2020-10-03 DIAGNOSIS — Z7982 Long term (current) use of aspirin: Secondary | ICD-10-CM

## 2020-10-03 DIAGNOSIS — N1832 Chronic kidney disease, stage 3b: Secondary | ICD-10-CM | POA: Diagnosis present

## 2020-10-03 DIAGNOSIS — Z833 Family history of diabetes mellitus: Secondary | ICD-10-CM | POA: Diagnosis not present

## 2020-10-03 DIAGNOSIS — Z79890 Hormone replacement therapy (postmenopausal): Secondary | ICD-10-CM

## 2020-10-03 DIAGNOSIS — I421 Obstructive hypertrophic cardiomyopathy: Secondary | ICD-10-CM | POA: Diagnosis present

## 2020-10-03 DIAGNOSIS — R0902 Hypoxemia: Secondary | ICD-10-CM | POA: Diagnosis not present

## 2020-10-03 DIAGNOSIS — H9193 Unspecified hearing loss, bilateral: Secondary | ICD-10-CM | POA: Diagnosis present

## 2020-10-03 DIAGNOSIS — I471 Supraventricular tachycardia: Secondary | ICD-10-CM | POA: Diagnosis present

## 2020-10-03 DIAGNOSIS — Z8546 Personal history of malignant neoplasm of prostate: Secondary | ICD-10-CM

## 2020-10-03 DIAGNOSIS — R55 Syncope and collapse: Secondary | ICD-10-CM

## 2020-10-03 DIAGNOSIS — W19XXXA Unspecified fall, initial encounter: Secondary | ICD-10-CM | POA: Diagnosis present

## 2020-10-03 DIAGNOSIS — R001 Bradycardia, unspecified: Secondary | ICD-10-CM | POA: Diagnosis not present

## 2020-10-03 DIAGNOSIS — N179 Acute kidney failure, unspecified: Secondary | ICD-10-CM | POA: Diagnosis present

## 2020-10-03 DIAGNOSIS — Z87891 Personal history of nicotine dependence: Secondary | ICD-10-CM

## 2020-10-03 DIAGNOSIS — R112 Nausea with vomiting, unspecified: Secondary | ICD-10-CM | POA: Diagnosis not present

## 2020-10-03 DIAGNOSIS — I422 Other hypertrophic cardiomyopathy: Secondary | ICD-10-CM | POA: Diagnosis present

## 2020-10-03 DIAGNOSIS — Z66 Do not resuscitate: Secondary | ICD-10-CM | POA: Diagnosis present

## 2020-10-03 DIAGNOSIS — R2990 Unspecified symptoms and signs involving the nervous system: Secondary | ICD-10-CM | POA: Diagnosis not present

## 2020-10-03 DIAGNOSIS — G9341 Metabolic encephalopathy: Secondary | ICD-10-CM | POA: Diagnosis present

## 2020-10-03 DIAGNOSIS — Y92009 Unspecified place in unspecified non-institutional (private) residence as the place of occurrence of the external cause: Secondary | ICD-10-CM | POA: Diagnosis not present

## 2020-10-03 DIAGNOSIS — E039 Hypothyroidism, unspecified: Secondary | ICD-10-CM | POA: Diagnosis present

## 2020-10-03 DIAGNOSIS — Z79899 Other long term (current) drug therapy: Secondary | ICD-10-CM

## 2020-10-03 DIAGNOSIS — M47812 Spondylosis without myelopathy or radiculopathy, cervical region: Secondary | ICD-10-CM | POA: Diagnosis not present

## 2020-10-03 DIAGNOSIS — Z96641 Presence of right artificial hip joint: Secondary | ICD-10-CM | POA: Diagnosis present

## 2020-10-03 DIAGNOSIS — I248 Other forms of acute ischemic heart disease: Secondary | ICD-10-CM | POA: Diagnosis present

## 2020-10-03 DIAGNOSIS — E860 Dehydration: Principal | ICD-10-CM | POA: Diagnosis present

## 2020-10-03 DIAGNOSIS — R4182 Altered mental status, unspecified: Secondary | ICD-10-CM | POA: Diagnosis not present

## 2020-10-03 DIAGNOSIS — I2510 Atherosclerotic heart disease of native coronary artery without angina pectoris: Secondary | ICD-10-CM | POA: Diagnosis not present

## 2020-10-03 DIAGNOSIS — I951 Orthostatic hypotension: Secondary | ICD-10-CM | POA: Diagnosis present

## 2020-10-03 DIAGNOSIS — J3489 Other specified disorders of nose and nasal sinuses: Secondary | ICD-10-CM | POA: Diagnosis not present

## 2020-10-03 DIAGNOSIS — I4430 Unspecified atrioventricular block: Secondary | ICD-10-CM | POA: Diagnosis not present

## 2020-10-03 DIAGNOSIS — S0083XA Contusion of other part of head, initial encounter: Secondary | ICD-10-CM | POA: Diagnosis present

## 2020-10-03 LAB — CBC WITH DIFFERENTIAL/PLATELET
Abs Immature Granulocytes: 0.03 10*3/uL (ref 0.00–0.07)
Basophils Absolute: 0 10*3/uL (ref 0.0–0.1)
Basophils Relative: 0 %
Eosinophils Absolute: 0 10*3/uL (ref 0.0–0.5)
Eosinophils Relative: 0 %
HCT: 37.3 % — ABNORMAL LOW (ref 39.0–52.0)
Hemoglobin: 12.3 g/dL — ABNORMAL LOW (ref 13.0–17.0)
Immature Granulocytes: 0 %
Lymphocytes Relative: 6 %
Lymphs Abs: 0.5 10*3/uL — ABNORMAL LOW (ref 0.7–4.0)
MCH: 32.5 pg (ref 26.0–34.0)
MCHC: 33 g/dL (ref 30.0–36.0)
MCV: 98.4 fL (ref 80.0–100.0)
Monocytes Absolute: 0.3 10*3/uL (ref 0.1–1.0)
Monocytes Relative: 4 %
Neutro Abs: 6.9 10*3/uL (ref 1.7–7.7)
Neutrophils Relative %: 90 %
Platelets: 137 10*3/uL — ABNORMAL LOW (ref 150–400)
RBC: 3.79 MIL/uL — ABNORMAL LOW (ref 4.22–5.81)
RDW: 13.5 % (ref 11.5–15.5)
WBC: 7.7 10*3/uL (ref 4.0–10.5)
nRBC: 0 % (ref 0.0–0.2)

## 2020-10-03 LAB — COMPREHENSIVE METABOLIC PANEL
ALT: 22 U/L (ref 0–44)
AST: 26 U/L (ref 15–41)
Albumin: 3.6 g/dL (ref 3.5–5.0)
Alkaline Phosphatase: 49 U/L (ref 38–126)
Anion gap: 6 (ref 5–15)
BUN: 26 mg/dL — ABNORMAL HIGH (ref 8–23)
CO2: 24 mmol/L (ref 22–32)
Calcium: 8.5 mg/dL — ABNORMAL LOW (ref 8.9–10.3)
Chloride: 106 mmol/L (ref 98–111)
Creatinine, Ser: 1.51 mg/dL — ABNORMAL HIGH (ref 0.61–1.24)
GFR, Estimated: 42 mL/min — ABNORMAL LOW (ref >60)
Glucose, Bld: 124 mg/dL — ABNORMAL HIGH (ref 70–99)
Potassium: 4.2 mmol/L (ref 3.5–5.1)
Sodium: 136 mmol/L (ref 135–145)
Total Bilirubin: 1 mg/dL (ref 0.3–1.2)
Total Protein: 5.8 g/dL — ABNORMAL LOW (ref 6.5–8.1)

## 2020-10-03 LAB — TROPONIN I (HIGH SENSITIVITY)
Troponin I (High Sensitivity): 262 ng/L (ref <18)
Troponin I (High Sensitivity): 283 ng/L (ref <18)

## 2020-10-03 LAB — RESP PANEL BY RT-PCR (FLU A&B, COVID) ARPGX2
Influenza A by PCR: NEGATIVE
Influenza B by PCR: NEGATIVE
SARS Coronavirus 2 by RT PCR: NEGATIVE

## 2020-10-03 LAB — URINALYSIS, ROUTINE W REFLEX MICROSCOPIC
Bilirubin Urine: NEGATIVE
Glucose, UA: NEGATIVE mg/dL
Hgb urine dipstick: NEGATIVE
Ketones, ur: 5 mg/dL — AB
Leukocytes,Ua: NEGATIVE
Nitrite: NEGATIVE
Protein, ur: NEGATIVE mg/dL
Specific Gravity, Urine: 1.021 (ref 1.005–1.030)
pH: 5 (ref 5.0–8.0)

## 2020-10-03 LAB — MAGNESIUM: Magnesium: 2 mg/dL (ref 1.7–2.4)

## 2020-10-03 LAB — LACTIC ACID, PLASMA: Lactic Acid, Venous: 1.8 mmol/L (ref 0.5–1.9)

## 2020-10-03 LAB — TSH: TSH: 0.478 u[IU]/mL (ref 0.350–4.500)

## 2020-10-03 LAB — CBG MONITORING, ED: Glucose-Capillary: 112 mg/dL — ABNORMAL HIGH (ref 70–99)

## 2020-10-03 LAB — T4, FREE: Free T4: 1.08 ng/dL (ref 0.61–1.12)

## 2020-10-03 LAB — PHOSPHORUS: Phosphorus: 3.9 mg/dL (ref 2.5–4.6)

## 2020-10-03 LAB — CK: Total CK: 134 U/L (ref 49–397)

## 2020-10-03 MED ORDER — ACETAMINOPHEN 650 MG RE SUPP: 650.0000 mg | Freq: Four times a day (QID) | RECTAL | Status: DC | PRN

## 2020-10-03 MED ORDER — SODIUM CHLORIDE 0.9 % IV BOLUS
1000.0000 mL | Freq: Once | INTRAVENOUS | Status: AC
  Administered 2020-10-03: 1000 mL via INTRAVENOUS

## 2020-10-03 MED ORDER — ASPIRIN 81 MG PO CHEW
324.0000 mg | CHEWABLE_TABLET | Freq: Once | ORAL | Status: AC
  Administered 2020-10-03: 324 mg via ORAL
  Filled 2020-10-03: qty 4

## 2020-10-03 MED ORDER — ACETAMINOPHEN 325 MG PO TABS: 650.0000 mg | ORAL_TABLET | Freq: Four times a day (QID) | ORAL | Status: DC | PRN

## 2020-10-03 MED ORDER — LEVOTHYROXINE SODIUM 25 MCG PO TABS
125.0000 ug | ORAL_TABLET | Freq: Every day | ORAL | Status: DC
  Administered 2020-10-04 – 2020-10-05 (×2): 125 ug via ORAL
  Filled 2020-10-03 (×2): qty 1

## 2020-10-03 MED ORDER — SODIUM CHLORIDE 0.9 % IV SOLN
75.0000 mL/h | INTRAVENOUS | Status: AC
  Administered 2020-10-03: 75 mL/h via INTRAVENOUS

## 2020-10-03 NOTE — ED Notes (Signed)
Dr Billy Fischer made aware of critical trop

## 2020-10-03 NOTE — ED Notes (Signed)
MD contacted about pt's BP. New orders placed.

## 2020-10-03 NOTE — ED Triage Notes (Signed)
Pt BIB [facility] after syncopal episode at home. Complains of n/v/d x2 last night. No PO intake today. EMS noted bradycardia and hypotension and orthostatic hypotension when patient attempted to move to stretcher. HR 45 en route. 700 mL fluid given PTA

## 2020-10-03 NOTE — ED Provider Notes (Signed)
[facility] EMERGENCY DEPARTMENT Provider Note   CSN: [id number] Arrival date & time: 10/03/20  1415     History Chief Complaint  Patient presents with  . Bradycardia    After N/V since yesterday  . syncopal episode    Edward Foster is a 87 y.o. male.  HPI     87 year old male with history of hypertrophic cardiomyopathy, hypothyroidism, prostate cancer presents with concern for syncope.  Reported nausea, vomiting and diarrhea x2 last night.  Has not been eating or drinking today.  Was hypotensive and bradycardic with EMS and given 700 mL of fluid.  Lives alone, good routine, wakes in the AM, exercises, eats breakfast, reads the paper  Neighbors today noticed he didn't go get paper or open blinds, he was sitting on chair, sleeping and they didn't think his answers made sense, had bump on his head but said he didn't fall, called doctor and went to see him, called EMS.  Yesterday walked around the block, had dinner with daughter, ate normally, left last night around 730PM, after that not sure what happened except neighbors found him sitting in closet and he didn't respond when they came into house. He does doze off if he sits too long. Vaccinated, boosted, had physical and had excellent blood work.   Cardiologist decreased his bisoprolol in half more than a year ago. Yesterday when they walked he did say at one point his legs seemed weak.  Said there was blood in his vomit, did have red wine, said he had diarrhea as well, no black or bloody stools Took some otc medication for bladder control, bisoprolol, thyroid medication, no recent changes or concern for inappropriately taking it       Past Medical History:  Diagnosis Date  . Heart murmur   . HOH (hard of hearing)   . Hypertrophic cardiomyopathy ([place])    Echo 5/18: severe conc LVH, EF 50-55, LVOT gradient not well characterized but peak velocity 2.5 m/sec; mild to mod AS (mean 19, peak 33), mild AI,  severe post MAC, mod LAE, PFO cannot be excluded  . Hypothyroidism   . Prostate cancer [facility])     Patient Active Problem List   Diagnosis Date Noted  . Acute metabolic encephalopathy 84/16/6063  . Stroke-like symptoms 10/03/2020  . Elevated troponin 10/03/2020  . Fall at home, initial encounter 10/03/2020  . AKI (acute kidney injury) ([place]) 10/03/2020  . Dehydration 10/03/2020  . 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 Bunionette of left foot 01/19/2019  . Osteoarthritis of right hip 01/19/2019  . Greater trochanteric bursitis of right hip 12/22/2018  . Leg length discrepancy 12/22/2018  . Hypertrophic obstructive cardiomyopathy ([place]) 07/19/2012    Past Surgical History:  Procedure Laterality Date  . SEED IMPLANTS - PROSTATE    . TOTAL HIP ARTHROPLASTY Right 05/29/2019   Procedure: RIGHT TOTAL HIP ARTHROPLASTY ANTERIOR APPROACH;  Surgeon: Frederik Pear, MD;  Location: [facility] ORS;  Service: Orthopedics;  Laterality: Right;       Family History  Problem Relation Age of Onset  . Diabetes Mother     Social History   Tobacco Use  . Smoking status: Former Smoker    Years: 20.00    Quit date: 07/19/1973    Years since quitting: 47.2  . Smokeless tobacco: Former Network engineer  . Vaping Use: Never used  Substance Use Topics  . Alcohol use: Yes    Alcohol/week: 8.0 standard drinks    Types:  7 Standard drinks or equivalent, 1 Shots of liquor per week  . Drug use: No    Home Medications Prior to Admission medications   Medication Sig Start Date End Date Taking? Authorizing Provider  aspirin 81 MG chewable tablet Chew 81 mg by mouth as needed (for pain).   Yes [provider]  bisoprolol (ZEBETA) 5 MG tablet Take 0.5 tablets (2.5 mg total) by mouth daily. Patient taking differently: Take 2.5 mg by mouth in the morning. 02/07/19  Yes Weaver, Scott T, PA-C  Calcium Carbonate (CALCIUM 600 PO) Take 600 mg by mouth daily.    Yes [provider]   levothyroxine (SYNTHROID, LEVOTHROID) 125 MCG tablet Take 125 mcg by mouth daily before breakfast.   Yes [provider]  Multiple Vitamins-Minerals (OCUVITE ADULT 50+) CAPS Take 1 capsule by mouth 3 (three) times a week.   Yes [provider]  OVER THE COUNTER MEDICATION Take 1 capsule by mouth See admin instructions. Unnamed OTC capsule for prostate health- Take 1 or 2 capsules by mouth once a day   Yes [provider]  fluticasone (FLONASE) 50 MCG/ACT nasal spray Place 1 spray into both nostrils daily.    [provider]    Allergies    Patient has no known allergies.  Review of Systems   Review of Systems  Constitutional: Negative for fever.  HENT: Negative for sore throat.   Eyes: Negative for visual disturbance.  Respiratory: Negative for shortness of breath.   Cardiovascular: Negative for chest pain.  Gastrointestinal: Positive for diarrhea, nausea and vomiting. Negative for abdominal pain.  Genitourinary: Negative for difficulty urinating.  Musculoskeletal: Negative for back pain and neck stiffness.  Skin: Negative for rash.  Neurological: Positive for syncope and light-headedness. Negative for headaches.    Physical Exam Updated Vital Signs BP (!) 107/54   Pulse 61   Temp 98.3 F (36.8 C) (Oral)   Resp (!) 23   SpO2 94%   Physical Exam Vitals and nursing note reviewed.  Constitutional:      General: He is not in acute distress.    Appearance: He is well-developed. He is not diaphoretic.  HENT:     Head: Normocephalic and atraumatic.  Eyes:     Conjunctiva/sclera: Conjunctivae normal.  Cardiovascular:     Rate and Rhythm: Regular rhythm. Bradycardia present.     Heart sounds: Normal heart sounds. No murmur heard. No friction rub. No gallop.   Pulmonary:     Effort: Pulmonary effort is normal. No respiratory distress.     Breath sounds: Normal breath sounds. No wheezing or rales.  Abdominal:     General: There is no  distension.     Palpations: Abdomen is soft.     Tenderness: There is no abdominal tenderness. There is no guarding.  Musculoskeletal:     Cervical back: Normal range of motion.  Skin:    General: Skin is warm and dry.  Neurological:     Mental Status: He is alert and oriented to person, place, and time.     ED Results / Procedures / Treatments   Labs (all labs ordered are listed, but only abnormal results are displayed) Labs Reviewed  CBC WITH DIFFERENTIAL/PLATELET - Abnormal; Notable for the following components:      Result Value   RBC 3.79 (*)    Hemoglobin 12.3 (*)    HCT 37.3 (*)    Platelets 137 (*)    Lymphs Abs 0.5 (*)    All  other components within normal limits  COMPREHENSIVE METABOLIC PANEL - Abnormal; Notable for the following components:   Glucose, Bld 124 (*)    BUN 26 (*)    Creatinine, Ser 1.51 (*)    Calcium 8.5 (*)    Total Protein 5.8 (*)    GFR, Estimated 42 (*)    All other components within normal limits  URINALYSIS, ROUTINE W REFLEX MICROSCOPIC - Abnormal; Notable for the following components:   Ketones, ur 5 (*)    All other components within normal limits  CBG MONITORING, ED - Abnormal; Notable for the following components:   Glucose-Capillary 112 (*)    All other components within normal limits  TROPONIN I (HIGH SENSITIVITY) - Abnormal; Notable for the following components:   Troponin I (High Sensitivity) 262 (*)    All other components within normal limits  TROPONIN I (HIGH SENSITIVITY) - Abnormal; Notable for the following components:   Troponin I (High Sensitivity) 283 (*)    All other components within normal limits  TROPONIN I (HIGH SENSITIVITY) - Abnormal; Notable for the following components:   Troponin I (High Sensitivity) 273 (*)    All other components within normal limits  RESP PANEL BY RT-PCR (FLU A&B, COVID) ARPGX2  TSH  T4, FREE  CK  LACTIC ACID, PLASMA  MAGNESIUM  PHOSPHORUS  MAGNESIUM  PHOSPHORUS  CBC WITH  DIFFERENTIAL/PLATELET  TSH  COMPREHENSIVE METABOLIC PANEL  LIPID PANEL  OSMOLALITY, URINE  CREATININE, URINE, RANDOM  SODIUM, URINE, RANDOM    EKG EKG Interpretation  Date/Time:  Thursday October 03 2020 14:18:33 EDT Ventricular Rate:  47 PR Interval:  300 QRS Duration: 180 QT Interval:  555 QTC Calculation: 491 R Axis:   5 Text Interpretation: Sinus bradycardia Prolonged PR interval Left bundle branch block Confirmed by Davonna Belling [phone]) on 10/03/2020 2:32:51 PM   Radiology CT Head Wo Contrast  Result Date: 10/03/2020 CLINICAL DATA:  87 year old post fall.  Abnormal mental status. EXAM: CT HEAD WITHOUT CONTRAST CT CERVICAL SPINE WITHOUT CONTRAST TECHNIQUE: Multidetector CT imaging of the head and cervical spine was performed following the standard protocol without intravenous contrast. Multiplanar CT image reconstructions of the cervical spine were also generated. All images are located in the cervical spine CT in PACS. COMPARISON:  None. FINDINGS: CT HEAD FINDINGS Brain: Age related atrophy. Mild chronic small vessel ischemia. There is an area of low-density involving the right cerebellar hemisphere that may represent subacute infarct. Alternatively a streak artifact from adjacent skull base is considered. No evidence hemorrhage, hydrocephalus, extra-axial collection or mass lesion/mass effect. Vascular: Atherosclerosis of skullbase vasculature without hyperdense vessel or abnormal calcification. Skull: No fracture or focal lesion. Sinuses/Orbits: Scattered mucosal thickening throughout ethmoid air cells as well as both sphenoid sinuses. Mastoid air cells are clear. Bilateral cataract resection. No acute orbital abnormality. Other: None. CT CERVICAL SPINE FINDINGS Alignment: Chest anterolisthesis of C4 on C5 and T1 on T2, likely degenerative. No traumatic subluxation. Skull base and vertebrae: No acute fracture. Vertebral body heights are maintained. The dens and skull base are  intact. Prominent multilevel facet hypertrophy. Soft tissues and spinal canal: No prevertebral fluid or swelling. No visible canal hematoma.1 Disc levels: Diffuse degenerative disc disease, prominent involving C5-C6 and C6-C7. prominent multilevel facet hypertrophy. Partial ankylosis of C3-C4 on the left slightly degenerative. Osteophyte versus osseous projection arising from left C5 facet causes narrowing of the spinal canal. There is multilevel neural foraminal narrowing. Upper chest: No acute or unexpected findings scoliosis of the upper thoracic spine.  Other: None. IMPRESSION: 1. No acute intracranial abnormality. No skull fracture. 2. Age related atrophy and chronic small vessel ischemia. 3. Low-density in the right cerebellar hemisphere may represent subacute infarct, or artifact from adjacent skull base. Consider further evaluation with MRI. 4. Multilevel degenerative change throughout the cervical spine without acute fracture or subluxation. 5. Osteophyte versus osseous projection arising from the left C5 facet causes narrowing of the spinal canal. Electronically Signed   By: Keith Rake M.D.   On: 10/03/2020 17:28   CT Cervical Spine Wo Contrast  Result Date: 10/03/2020 CLINICAL DATA:  87 year old post fall.  Abnormal mental status. EXAM: CT HEAD WITHOUT CONTRAST CT CERVICAL SPINE WITHOUT CONTRAST TECHNIQUE: Multidetector CT imaging of the head and cervical spine was performed following the standard protocol without intravenous contrast. Multiplanar CT image reconstructions of the cervical spine were also generated. All images are located in the cervical spine CT in PACS. COMPARISON:  None. FINDINGS: CT HEAD FINDINGS Brain: Age related atrophy. Mild chronic small vessel ischemia. There is an area of low-density involving the right cerebellar hemisphere that may represent subacute infarct. Alternatively a streak artifact from adjacent skull base is considered. No evidence hemorrhage, hydrocephalus,  extra-axial collection or mass lesion/mass effect. Vascular: Atherosclerosis of skullbase vasculature without hyperdense vessel or abnormal calcification. Skull: No fracture or focal lesion. Sinuses/Orbits: Scattered mucosal thickening throughout ethmoid air cells as well as both sphenoid sinuses. Mastoid air cells are clear. Bilateral cataract resection. No acute orbital abnormality. Other: None. CT CERVICAL SPINE FINDINGS Alignment: Chest anterolisthesis of C4 on C5 and T1 on T2, likely degenerative. No traumatic subluxation. Skull base and vertebrae: No acute fracture. Vertebral body heights are maintained. The dens and skull base are intact. Prominent multilevel facet hypertrophy. Soft tissues and spinal canal: No prevertebral fluid or swelling. No visible canal hematoma.1 Disc levels: Diffuse degenerative disc disease, prominent involving C5-C6 and C6-C7. prominent multilevel facet hypertrophy. Partial ankylosis of C3-C4 on the left slightly degenerative. Osteophyte versus osseous projection arising from left C5 facet causes narrowing of the spinal canal. There is multilevel neural foraminal narrowing. Upper chest: No acute or unexpected findings scoliosis of the upper thoracic spine. Other: None. IMPRESSION: 1. No acute intracranial abnormality. No skull fracture. 2. Age related atrophy and chronic small vessel ischemia. 3. Low-density in the right cerebellar hemisphere may represent subacute infarct, or artifact from adjacent skull base. Consider further evaluation with MRI. 4. Multilevel degenerative change throughout the cervical spine without acute fracture or subluxation. 5. Osteophyte versus osseous projection arising from the left C5 facet causes narrowing of the spinal canal. Electronically Signed   By: Keith Rake M.D.   On: 10/03/2020 17:28   DG Chest Portable 1 View  Result Date: 10/03/2020 CLINICAL DATA:  Shortness of breath.  Syncope at home. EXAM: PORTABLE CHEST 1 VIEW COMPARISON:   05/29/2019 FINDINGS: Lung volumes are low. Upper normal heart size is likely accentuated by portable technique and low lung volumes. Vascular congestion versus bronchovascular crowding. No confluent consolidation. No pneumothorax or large pleural effusion. No acute osseous abnormalities are seen. IMPRESSION: Low lung volumes with vascular congestion versus bronchovascular crowding. Electronically Signed   By: Keith Rake M.D.   On: 10/03/2020 16:35    Procedures Procedures   Medications Ordered in ED Medications  levothyroxine (SYNTHROID) tablet 125 mcg (has no administration in time range)  0.9 %  sodium chloride infusion (75 mL/hr Intravenous New Bag/Given 10/03/20 2034)  acetaminophen (TYLENOL) tablet 650 mg (has no administration in time range)  Or  acetaminophen (TYLENOL) suppository 650 mg (has no administration in time range)  aspirin chewable tablet 324 mg (324 mg Oral Given 10/03/20 1921)  sodium chloride 0.9 % bolus 1,000 mL (0 mLs Intravenous Stopped 10/03/20 2300)    ED Course  I have reviewed the triage vital signs and the nursing notes.  Pertinent labs & imaging results that were available during my care of the patient were reviewed by me and considered in my medical decision making (see chart for details).    MDM Rules/Calculators/A&P                           87 year old male with history of hypertrophic cardiomyopathy, hypothyroidism, prostate cancer presents with concern for fatigue, generalized weakness, nausea, vomiting, bradycardia, suspected syncope.  CT shows low density right cerebellar hemorrhage which may represent subacute infarct or artifact.  Discussed we can order MRI for further evaluation but may not acutely change plan of care.  Labs show no sign of significant anemia, no clinically significant electrolyte abnormalities.  EKG shows sinus bradycardia with bundle branch block.  Troponin elevated to the 200s.  Unclear if event last night with nausea  represented ACS, or troponin elevation is secondary to age, kidney disease and acute stress.  Consulted cardiology who will evaluate patient in the morning.  Will admit to the hospitalist for continued care bradycardia, suspected syncopal episode, elevated troponin, and possible CVA on CT.  He is DNR, DNI, and discussed possible MRI for further evaluation of abnormality on CT with family, and agree at this time to hold off as we do not feel it will change his acute care or management. Admitted to hospitalist for continued care.    Final Clinical Impression(s) / ED Diagnoses Final diagnoses:  Bradycardia  Syncope, unspecified syncope type  Troponin level elevated  Non-intractable vomiting with nausea, unspecified vomiting type    Rx / DC Orders ED Discharge Orders    None       Gareth Morgan, MD 10/04/20 [phone]

## 2020-10-03 NOTE — H&P (Signed)
Edward Foster OMB:[id number] DOB: Oct 21, 1922 DOA: 10/03/2020   PCP: Velna Hatchet, MD   Outpatient Specialists:  CARDS:  Dr.McAlhany, Annita Brod, MD   Patient arrived to ER on 10/03/20 at 1415 Referred by Attending Gareth Morgan, MD   Patient coming from: home Lives alone,     Chief Complaint:  Chief Complaint  Patient presents with  . Bradycardia    After N/V since yesterday  . syncopal episode    HPI: Edward Foster is a 87 y.o. male with medical history significant of hypertrophic cardiomyopathy, hypothyroidism prostate cancer s/p brachytherapy and XRT, LBBB    Presented with possibly syncopal event at home had N/v/D for the past 2 days has hardly been eating today.  Apparently patient usually gets his papers every day and often winds but today his neighbor noted that that did not happen they went to check on him noted he he was sitting in a chair he had a bump on his head but denied falling.  Last time seen normal was 7:30 PM last night when he walks around the block and had dinner with his daughter. When questioned patient endorsed that he had some blood in his vomit but not sure because he drank some red wine no black stools no tarry stools. He also try to use some over-the-counter medication to control his bladder Recently his beta-blocker was decreased.  EMS noted to be bradycardic and hypotensive is also orthostatic Heart rate was noted to be 45 while in route he was given 700 mL of fluids  Has history of idiopathic sub-aortic stenosis. No prior CAD or MI.  He moved from [place] in June 2013 to be near his family  Has been seen by cardiology for presyncopal episodes Echo May 2018 with normal LV function, severe concentric LVH, mild AS, overall unchanged.  24 hour cardiac monitor with sinus rhythm, PVCs, no long pauses. He had a syncopal episode in February 2020. He was seen in our office August 2020 by Richardson Dopp, PA-C and his beta blocker dose was decreased.  Cardiac monitor with several quick bursts of SVT but no pauses or heart block noted.   Family states pt has hx of low BP and Heart rate   Has been vaccinated against COVID and boosted   Initial COVID TEST  NEGATIVE   Lab Results  Component Value Date   [place] NEGATIVE 10/03/2020   [place] NOT DETECTED 05/25/2019    Regarding pertinent Chronic problems:       HTN on Zebeta   chronic CHF diastolic  - last echo 4536 There was severe concentric hypertrophy.    Hypothyroidism:  Lab Results  Component Value Date   TSH 0.478 10/03/2020   on synthroid  OSA - not on nocturnal   CPAP     CKD stage IIIb- baseline Cr 1.3 CrCl cannot be calculated (Unknown ideal weight.).  Lab Results  Component Value Date   CREATININE 1.51 (H) 10/03/2020   CREATININE 1.30 (H) 05/25/2019    While in ER: Noted to be sig confused CT head showing subacute CVA, family declined MRI Troponin elevated no CP Pt remains confused Cardiology was consult family would like to avoid over aggressive intervetions Medical therapy as needed   ED Triage Vitals  Enc Vitals Group     BP 10/03/20 1445 (!) 104/52     Pulse Rate 10/03/20 1445 (!) 51     Resp 10/03/20 1445 14     Temp 10/03/20 1448 (!) 97 F (36.1  C)     Temp Source 10/03/20 1448 Rectal     SpO2 10/03/20 1430 99 %     Weight --      Height --      Head Circumference --      Peak Flow --      Pain Score 10/03/20 1449 0     Pain Loc --      Pain Edu? --      Excl. in [place]? --   TMAX(24)@     _________________________________________ Significant initial  Findings: Abnormal Labs Reviewed  CBC WITH DIFFERENTIAL/PLATELET - Abnormal; Notable for the following components:      Result Value   RBC 3.79 (*)    Hemoglobin 12.3 (*)    HCT 37.3 (*)    Platelets 137 (*)    Lymphs Abs 0.5 (*)    All other components within normal limits  COMPREHENSIVE METABOLIC PANEL - Abnormal; Notable for the following components:   Glucose, Bld 124  (*)    BUN 26 (*)    Creatinine, Ser 1.51 (*)    Calcium 8.5 (*)    Total Protein 5.8 (*)    GFR, Estimated 42 (*)    All other components within normal limits  TROPONIN I (HIGH SENSITIVITY) - Abnormal; Notable for the following components:   Troponin I (High Sensitivity) 262 (*)    All other components within normal limits   ____________________________________________ Ordered CT HEAD Low-density in the right cerebellar hemisphere may represent subacute infarct, or artifact from adjacent skull base. Consider further evaluation with MRI.  CXR - NON acute   _________________________ Troponin 262 ECG: Ordered Personally reviewed by me showing: HR : 47 Rhythm: sinus brady   LBBB,    no evidence of ischemic changes QTC 491 ____________________ This patient meets SIRS Criteria and may be septic. SIRS = Systemic Inflammatory Response Syndrome  Order a lactic acid level if needed AND/OR Initiate the sepsis protocol with the attached order set OR Click "Treating Associated Infection or Illness" if the patient is being treated for an infection that is a known cause of these abnormalities     The recent clinical data is shown below. Vitals:   10/03/20 1630 10/03/20 1645 10/03/20 1816 10/03/20 1849  BP: 102/66 (!) 98/49 (!) 99/52 (!) 105/49  Pulse: (!) 50 (!) 52 (!) 56 (!) 57  Resp: _0 Temp:      TempSrc:      SpO2: 97% 100% 100% 97%     WBC      Component Value Date/Time   WBC 7.7 10/03/2020 1612   LYMPHSABS 0.5 (L) 10/03/2020 1612   MONOABS 0.3 10/03/2020 1612   EOSABS 0.0 10/03/2020 1612   BASOSABS 0.0 10/03/2020 1612   Lactic Acid, Venous No results found for: LATICACIDVEN    UA  ordered    Results for orders placed or performed during the hospital encounter of 10/03/20  Resp Panel by RT-PCR (Flu A&B, Covid) Nasopharyngeal Swab     Status: None   Collection Time: 10/03/20  4:33 PM   Specimen: Nasopharyngeal Swab; Nasopharyngeal(NP) swabs in vial  transport medium  Result Value Ref Range Status   SARS Coronavirus 2 by RT PCR NEGATIVE NEGATIVE Final         Influenza A by PCR NEGATIVE NEGATIVE Final   Influenza B by PCR NEGATIVE NEGATIVE Final           _______________________________________________________ ER Provider Called:  Cardiology  Dr.McDowel They Recommend admit to medicine   Will see in AM  _______________________________________________ Hospitalist was called for admission for dehydration possible Syncope and CT worrisome for new CVA  The following Work up has been ordered so far:  Orders Placed This Encounter  Procedures  . Resp Panel by RT-PCR (Flu A&B, Covid) Nasopharyngeal Swab  . DG Chest Portable 1 View  . CT Head Wo Contrast  . CT Cervical Spine Wo Contrast  . CBC with Differential  . Comprehensive metabolic panel  . TSH  . T4, free  . CK  . Urinalysis, Routine w reflex microscopic  . Consult to hospitalist  . Inpatient consult to Cardiology  . Airborne and Contact precautions  . EKG 12-Lead    Following Medications were ordered in ER: Medications  aspirin chewable tablet 324 mg (has no administration in time range)        Consult Orders  (From admission, onward)         Start     Ordered   10/03/20 1854  Consult to hospitalist  Once       Provider:  (Not yet assigned)  Question Answer Comment  Place call to: [facility]   Reason for Consult Admit      10/03/20 1854           OTHER Significant initial  Findings:  labs showing:    Recent Labs  Lab 10/03/20 1612  NA 136  K 4.2  CO2 24  GLUCOSE 124*  BUN 26*  CREATININE 1.51*  CALCIUM 8.5*    Cr   Up from baseline see below Lab Results  Component Value Date   CREATININE 1.51 (H) 10/03/2020   CREATININE 1.30 (H) 05/25/2019    Recent Labs  Lab 10/03/20 1612  AST 26  ALT 22  ALKPHOS 49  BILITOT 1.0  PROT 5.8*  ALBUMIN 3.6   Lab Results  Component Value Date   CALCIUM 8.5 (L) 10/03/2020        Plt: Lab Results  Component Value Date   PLT 137 (L) 10/03/2020   COVID-19 Labs  No results for input(s): DDIMER, FERRITIN, LDH, CRP in the last 72 hours.  Lab Results  Component Value Date   SARSCOV2NAA NEGATIVE 10/03/2020   SARSCOV2NAA NOT DETECTED 05/25/2019         Recent Labs  Lab 10/03/20 1612  WBC 7.7  NEUTROABS 6.9  HGB 12.3*  HCT 37.3*  MCV 98.4  PLT 137*    HG/HCT  stable,       Component Value Date/Time   HGB 12.3 (L) 10/03/2020 1612   HCT 37.3 (L) 10/03/2020 1612   MCV 98.4 10/03/2020 1612    Cardiac Panel (last 3 results) Recent Labs    10/03/20 1612  CKTOTAL 134     CBG (last 3)  Recent Labs    10/03/20 2110  GLUCAP 112*      Cultures: No results found for: SDES, SPECREQUEST, CULT, REPTSTATUS   Radiological Exams on Admission: CT Head Wo Contrast  Result Date: 10/03/2020 CLINICAL DATA:  87 year old post fall.  Abnormal mental status. EXAM: CT HEAD WITHOUT CONTRAST CT CERVICAL SPINE WITHOUT CONTRAST TECHNIQUE: Multidetector CT imaging of the head and cervical spine was performed following the standard protocol without intravenous contrast. Multiplanar CT image reconstructions of the cervical spine were also generated. All images are located in the cervical spine CT in PACS. COMPARISON:  None. FINDINGS: CT HEAD FINDINGS Brain: Age related atrophy. Mild chronic small vessel ischemia.  There is an area of low-density involving the right cerebellar hemisphere that may represent subacute infarct. Alternatively a streak artifact from adjacent skull base is considered. No evidence hemorrhage, hydrocephalus, extra-axial collection or mass lesion/mass effect. Vascular: Atherosclerosis of skullbase vasculature without hyperdense vessel or abnormal calcification. Skull: No fracture or focal lesion. Sinuses/Orbits: Scattered mucosal thickening throughout ethmoid air cells as well as both sphenoid sinuses. Mastoid air cells are clear. Bilateral cataract  resection. No acute orbital abnormality. Other: None. CT CERVICAL SPINE FINDINGS Alignment: Chest anterolisthesis of C4 on C5 and T1 on T2, likely degenerative. No traumatic subluxation. Skull base and vertebrae: No acute fracture. Vertebral body heights are maintained. The dens and skull base are intact. Prominent multilevel facet hypertrophy. Soft tissues and spinal canal: No prevertebral fluid or swelling. No visible canal hematoma.1 Disc levels: Diffuse degenerative disc disease, prominent involving C5-C6 and C6-C7. prominent multilevel facet hypertrophy. Partial ankylosis of C3-C4 on the left slightly degenerative. Osteophyte versus osseous projection arising from left C5 facet causes narrowing of the spinal canal. There is multilevel neural foraminal narrowing. Upper chest: No acute or unexpected findings scoliosis of the upper thoracic spine. Other: None. IMPRESSION: 1. No acute intracranial abnormality. No skull fracture. 2. Age related atrophy and chronic small vessel ischemia. 3. Low-density in the right cerebellar hemisphere may represent subacute infarct, or artifact from adjacent skull base. Consider further evaluation with MRI. 4. Multilevel degenerative change throughout the cervical spine without acute fracture or subluxation. 5. Osteophyte versus osseous projection arising from the left C5 facet causes narrowing of the spinal canal. Electronically Signed   By: Keith Rake M.D.   On: 10/03/2020 17:28   CT Cervical Spine Wo Contrast  Result Date: 10/03/2020 CLINICAL DATA:  87 year old post fall.  Abnormal mental status. EXAM: CT HEAD WITHOUT CONTRAST CT CERVICAL SPINE WITHOUT CONTRAST TECHNIQUE: Multidetector CT imaging of the head and cervical spine was performed following the standard protocol without intravenous contrast. Multiplanar CT image reconstructions of the cervical spine were also generated. All images are located in the cervical spine CT in PACS. COMPARISON:  None. FINDINGS: CT  HEAD FINDINGS Brain: Age related atrophy. Mild chronic small vessel ischemia. There is an area of low-density involving the right cerebellar hemisphere that may represent subacute infarct. Alternatively a streak artifact from adjacent skull base is considered. No evidence hemorrhage, hydrocephalus, extra-axial collection or mass lesion/mass effect. Vascular: Atherosclerosis of skullbase vasculature without hyperdense vessel or abnormal calcification. Skull: No fracture or focal lesion. Sinuses/Orbits: Scattered mucosal thickening throughout ethmoid air cells as well as both sphenoid sinuses. Mastoid air cells are clear. Bilateral cataract resection. No acute orbital abnormality. Other: None. CT CERVICAL SPINE FINDINGS Alignment: Chest anterolisthesis of C4 on C5 and T1 on T2, likely degenerative. No traumatic subluxation. Skull base and vertebrae: No acute fracture. Vertebral body heights are maintained. The dens and skull base are intact. Prominent multilevel facet hypertrophy. Soft tissues and spinal canal: No prevertebral fluid or swelling. No visible canal hematoma.1 Disc levels: Diffuse degenerative disc disease, prominent involving C5-C6 and C6-C7. prominent multilevel facet hypertrophy. Partial ankylosis of C3-C4 on the left slightly degenerative. Osteophyte versus osseous projection arising from left C5 facet causes narrowing of the spinal canal. There is multilevel neural foraminal narrowing. Upper chest: No acute or unexpected findings scoliosis of the upper thoracic spine. Other: None. IMPRESSION: 1. No acute intracranial abnormality. No skull fracture. 2. Age related atrophy and chronic small vessel ischemia. 3. Low-density in the right cerebellar hemisphere may represent subacute infarct, or  artifact from adjacent skull base. Consider further evaluation with MRI. 4. Multilevel degenerative change throughout the cervical spine without acute fracture or subluxation. 5. Osteophyte versus osseous projection  arising from the left C5 facet causes narrowing of the spinal canal. Electronically Signed   By: Keith Rake M.D.   On: 10/03/2020 17:28   DG Chest Portable 1 View  Result Date: 10/03/2020 CLINICAL DATA:  Shortness of breath.  Syncope at home. EXAM: PORTABLE CHEST 1 VIEW COMPARISON:  05/29/2019 FINDINGS: Lung volumes are low. Upper normal heart size is likely accentuated by portable technique and low lung volumes. Vascular congestion versus bronchovascular crowding. No confluent consolidation. No pneumothorax or large pleural effusion. No acute osseous abnormalities are seen. IMPRESSION: Low lung volumes with vascular congestion versus bronchovascular crowding. Electronically Signed   By: Keith Rake M.D.   On: 10/03/2020 16:35   _______________________________________________________________________________________________________ Latest  Blood pressure (!) 105/49, pulse (!) 57, temperature (!) 97 F (36.1 C), temperature source Rectal, resp. rate 16, SpO2 97 %.   Review of Systems:    Pertinent positives include: fatigue,  Confusion, fall  Constitutional:  No weight loss, night sweats, Fevers, chills,  weight loss  HEENT:  No headaches, Difficulty swallowing,Tooth/dental problems,Sore throat,  No sneezing, itching, ear ache, nasal congestion, post nasal drip,  Cardio-vascular:  No chest pain, Orthopnea, PND, anasarca, dizziness, palpitations.no Bilateral lower extremity swelling  GI:  No heartburn, indigestion, abdominal pain, nausea, vomiting, diarrhea, change in bowel habits, loss of appetite, melena, blood in stool, hematemesis Resp:  no shortness of breath at rest. No dyspnea on exertion, No excess mucus, no productive cough, No non-productive cough, No coughing up of blood.No change in color of mucus.No wheezing. Skin:  no rash or lesions. No jaundice GU:  no dysuria, change in color of urine, no urgency or frequency. No straining to urinate.  No flank pain.   Musculoskeletal:  No joint pain or no joint swelling. No decreased range of motion. No back pain.  Psych:  No change in mood or affect. No depression or anxiety. No memory loss.  Neuro: no localizing neurological complaints, no tingling, no weakness, no double vision, no gait abnormality, no slurred speech, no  All systems reviewed and apart from [place] all are negative _______________________________________________________________________________________________ Past Medical History:   Past Medical History:  Diagnosis Date  . Heart murmur   . HOH (hard of hearing)   . Hypertrophic cardiomyopathy ([place])    Echo 5/18: severe conc LVH, EF 50-55, LVOT gradient not well characterized but peak velocity 2.5 m/sec; mild to mod AS (mean 19, peak 33), mild AI, severe post MAC, mod LAE, PFO cannot be excluded  . Hypothyroidism   . Prostate cancer [facility])       Past Surgical History:  Procedure Laterality Date  . SEED IMPLANTS - PROSTATE    . TOTAL HIP ARTHROPLASTY Right 05/29/2019   Procedure: RIGHT TOTAL HIP ARTHROPLASTY ANTERIOR APPROACH;  Surgeon: Frederik Pear, MD;  Location: [facility];  Service: Orthopedics;  Laterality: Right;    Social History:  Ambulatory   independently       reports that he quit smoking about 47 years ago. He quit after 20.00 years of use. He has quit using smokeless tobacco. He reports current alcohol use of about 8.0 standard drinks of alcohol per week. He reports that he does not use drugs.   Family History:   Family History  Problem Relation Age of Onset  . Diabetes Mother    ______________________________________________________________________________________________ Allergies: No  Known Allergies   Prior to Admission medications   Medication Sig Start Date End Date Taking? Authorizing Provider  aspirin 81 MG chewable tablet Chew 81 mg by mouth as needed (for pain).   Yes [provider]  bisoprolol (ZEBETA) 5 MG tablet Take 0.5 tablets (2.5  mg total) by mouth daily. Patient taking differently: Take 2.5 mg by mouth in the morning. 02/07/19  Yes Weaver, Scott T, PA-C  Calcium Carbonate (CALCIUM 600 PO) Take 600 mg by mouth daily.    Yes [provider]  levothyroxine (SYNTHROID, LEVOTHROID) 125 MCG tablet Take 125 mcg by mouth daily before breakfast.   Yes [provider]  Multiple Vitamins-Minerals (OCUVITE ADULT 50+) CAPS Take 1 capsule by mouth 3 (three) times a week.   Yes [provider]  OVER THE COUNTER MEDICATION Take 1 capsule by mouth See admin instructions. Unnamed OTC capsule for prostate health- Take 1 or 2 capsules by mouth once a day   Yes [provider]  fluticasone (FLONASE) 50 MCG/ACT nasal spray Place 1 spray into both nostrils daily.    [provider]    ___________________________________________________________________________________________________ Physical Exam: Vitals with BMI 10/03/2020 10/03/2020 10/03/2020  Height - - -  Weight - - -  BMI - - -  Systolic 397 99 98  Diastolic 49 52 49  Pulse 57 56 52     1. General:  in No  Acute distress    Chronically ill  -appearing 2. Psychological: Alert and   Oriented to self/ time not situation 3. Head/ENT:     Dry Mucous Membranes                          Head   Traumatic abrasion of the forehead, neck supple                           Poor Dentition 4. SKIN:  decreased Skin turgor,  Skin clean Dry and intact no rash 5. Heart: Regular rate and rhythm loud systolic Murmur, no Rub or gallop 6. Lungs , no wheezes or crackles   7. Abdomen: Soft,  non-tender, Non distended  bowel sounds present 8. Lower extremities: no clubbing, cyanosis, no edema 9. Neurologically   strength 5 out of 5 in all 4 extremities left facial droop, pt not fully cooperative with exam 10. MSK: Normal range of motion    Chart has been  reviewed  ______________________________________________________________________________________________  Assessment/Plan  87 y.o. male with medical history significant of hypertrophic cardiomyopathy, hypothyroidism prostate cancer s/p brachytherapy and XRT, LBBB Admitted for fall possible syncope suspected new CVA  Present on Admission: . Stroke-like symptoms -initial CT worrisome for new CVA.  Subacute.  Not a candidate for TPA at this time.  Family have stated they do not wish to proceed with MRI at this time Will order PT OT consult swallow evaluation, echogram and speech pathology Family would like to avoid over aggressive interventions  . Hypertrophic obstructive cardiomyopathy ([place]) -obtain echogram continue to monitor unclear if patient had a syncopal event patient unable to provide any history secondary to acute confusion  . Acute metabolic encephalopathy-likely in the setting of recent dehydration from nausea or vomiting, will evaluate for any source of infectious process we will await results of urine Patient also likely had recent new CVA family at this point would like to hold off on further work-up regarding Will evaluate with PT OT prior  to discharge if needs placement  . Elevated troponin -denies any chest pain unchanged EKG obtain echogram appreciate cardiology input  N/v/D - now resolved cont to monitor  AKI- will rehydrate, stric I/o, check FeNa  Bradycardia/hypotension hold zebeta and check TSH  Other plan as per orders.  DVT prophylaxis:  SCD      Code Status:    Code Status: Prior  DNR/DNI  Per family  I had personally discussed CODE STATUS with  family     Family Communication:   Family not at  Bedside  plan of care was discussed on the phone with  Daughter,   Disposition Plan:     likely will need placement for rehabilitation                        Following barriers for discharge:                            Electrolytes corrected                                                          Will need to be able to tolerate PO                            Will likely need home health, home O2, set up                           Will need consultants to evaluate patient prior to discharge                     Would benefit from PT/OT eval prior to DC  Ordered                   Swallow eval - SLP ordered                                      Nutrition    consulted                                                     Consults called: emailed Cardiology    Admission status:  ED Disposition    ED Disposition Condition [place]: [facility] [[id number]]  Level of Care: Telemetry Medical [104]  May admit patient to [facility] or [facility] if equivalent level of care is available:: No  Covid Evaluation: Confirmed COVID Negative  Diagnosis: Stroke-like symptoms [353614]  Admitting Physician: Toy Baker [[id number]]  Attending Physician: Toy Baker [[id number]]  Estimated length of stay: past midnight tomorrow  Certification:: I certify this patient will need inpatient services for at least 2 midnights          inpatient     I Expect 2 midnight stay secondary to severity of patient's current illness need for inpatient interventions justified by the following:  hemodynamic instability despite optimal  treatment (bradycardia  hypotension  )  Severe lab/radiological/exam abnormalities including:    Ct worrisome for CVA and extensive comorbidities including: advanced age That are currently affecting medical management.   I expect  patient to be hospitalized for 2 midnights requiring inpatient medical care.  Patient is at high risk for adverse outcome (such as loss of life or disability) if not treated.  Indication for inpatient stay as follows:  Severe change from baseline regarding mental status Hemodynamic instability despite maximal medical therapy,     Need for   IV fluids    Level of care   tele   Lab Results  Component Value Date   [place] NEGATIVE 10/03/2020     Precautions: admitted as Covid Negative   PPE: Used by the provider:   N95 eye Goggles,  Gloves   Ceirra Belli 10/03/2020, 9:33 PM    [facility]     after 2 AM please page floor coverage PA If 7AM-7PM, please contact the day team taking care of the patient using [email or web address]   Patient was evaluated in the context of the global COVID-19 pandemic, which necessitated consideration that the patient might be at risk for infection with the SARS-CoV-2 virus that causes COVID-19. Institutional protocols and algorithms that pertain to the evaluation of patients at risk for COVID-19 are in a state of rapid change based on information released by regulatory bodies including the CDC and federal and state organizations. These policies and algorithms were followed during the patient's care.

## 2020-10-04 ENCOUNTER — Other Ambulatory Visit (HOSPITAL_COMMUNITY): Payer: PPO

## 2020-10-04 LAB — LIPID PANEL
Cholesterol: 112 mg/dL (ref 0–200)
HDL: 26 mg/dL — ABNORMAL LOW (ref >40)
LDL Cholesterol: 62 mg/dL (ref 0–99)
Total CHOL/HDL Ratio: 4.3 RATIO
Triglycerides: 118 mg/dL (ref <150)
VLDL: 24 mg/dL (ref 0–40)

## 2020-10-04 LAB — COMPREHENSIVE METABOLIC PANEL
ALT: 21 U/L (ref 0–44)
AST: 27 U/L (ref 15–41)
Albumin: 3.2 g/dL — ABNORMAL LOW (ref 3.5–5.0)
Alkaline Phosphatase: 42 U/L (ref 38–126)
Anion gap: 8 (ref 5–15)
BUN: 29 mg/dL — ABNORMAL HIGH (ref 8–23)
CO2: 20 mmol/L — ABNORMAL LOW (ref 22–32)
Calcium: 7.9 mg/dL — ABNORMAL LOW (ref 8.9–10.3)
Chloride: 110 mmol/L (ref 98–111)
Creatinine, Ser: 1.56 mg/dL — ABNORMAL HIGH (ref 0.61–1.24)
GFR, Estimated: 41 mL/min — ABNORMAL LOW (ref >60)
Glucose, Bld: 109 mg/dL — ABNORMAL HIGH (ref 70–99)
Potassium: 4 mmol/L (ref 3.5–5.1)
Sodium: 138 mmol/L (ref 135–145)
Total Bilirubin: 0.5 mg/dL (ref 0.3–1.2)
Total Protein: 5.1 g/dL — ABNORMAL LOW (ref 6.5–8.1)

## 2020-10-04 LAB — TSH: TSH: 0.351 u[IU]/mL (ref 0.350–4.500)

## 2020-10-04 LAB — CBC WITH DIFFERENTIAL/PLATELET
Abs Immature Granulocytes: 0.02 10*3/uL (ref 0.00–0.07)
Basophils Absolute: 0 10*3/uL (ref 0.0–0.1)
Basophils Relative: 0 %
Eosinophils Absolute: 0 10*3/uL (ref 0.0–0.5)
Eosinophils Relative: 0 %
HCT: 31.8 % — ABNORMAL LOW (ref 39.0–52.0)
Hemoglobin: 10.6 g/dL — ABNORMAL LOW (ref 13.0–17.0)
Immature Granulocytes: 0 %
Lymphocytes Relative: 13 %
Lymphs Abs: 1.1 10*3/uL (ref 0.7–4.0)
MCH: 33.4 pg (ref 26.0–34.0)
MCHC: 33.3 g/dL (ref 30.0–36.0)
MCV: 100.3 fL — ABNORMAL HIGH (ref 80.0–100.0)
Monocytes Absolute: 0.6 10*3/uL (ref 0.1–1.0)
Monocytes Relative: 8 %
Neutro Abs: 6.5 10*3/uL (ref 1.7–7.7)
Neutrophils Relative %: 79 %
Platelets: 132 10*3/uL — ABNORMAL LOW (ref 150–400)
RBC: 3.17 MIL/uL — ABNORMAL LOW (ref 4.22–5.81)
RDW: 13.6 % (ref 11.5–15.5)
WBC: 8.3 10*3/uL (ref 4.0–10.5)
nRBC: 0 % (ref 0.0–0.2)

## 2020-10-04 LAB — TROPONIN I (HIGH SENSITIVITY)
Troponin I (High Sensitivity): 273 ng/L (ref <18)
Troponin I (High Sensitivity): 237 ng/L (ref <18)
Troponin I (High Sensitivity): 211 ng/L (ref <18)
Troponin I (High Sensitivity): 190 ng/L (ref <18)

## 2020-10-04 LAB — SODIUM, URINE, RANDOM: Sodium, Ur: 28 mmol/L

## 2020-10-04 LAB — MAGNESIUM: Magnesium: 1.9 mg/dL (ref 1.7–2.4)

## 2020-10-04 LAB — OSMOLALITY, URINE: Osmolality, Ur: 765 mOsm/kg (ref 300–900)

## 2020-10-04 LAB — PHOSPHORUS: Phosphorus: 3.5 mg/dL (ref 2.5–4.6)

## 2020-10-04 LAB — CREATININE, URINE, RANDOM: Creatinine, Urine: 193.77 mg/dL

## 2020-10-04 MED ORDER — SODIUM CHLORIDE 0.9 % IV SOLN: INTRAVENOUS | Status: AC

## 2020-10-04 NOTE — Consult Note (Addendum)
Cardiology Consultation:   Patient ID: Edward Foster: [id number]; DOB: July 06, 1923  Admit date: 10/03/2020 Date of Consult: 10/04/2020  PCP:  Edward Foster, [place]  Cardiologist:  Edward Chandler, MD   Patient Profile:   Edward Foster is a 87 y.o. male with a hx of hypertrophic cardiomyopathy, prostate cancer s/p brachytherapy and XRT, chronic LBBB, aortic stenosis and  hypothyroidism who is being seen today for the evaluation of presyncope and elevated troponin at the request of Edward Foster.   He has been followed by Dr. Cory Foster ([facility] [place]) for idiopathic sub-aortic stenosis. No prior CAD or MI. He moved from [place] in June 2013 to be near his daughter in [place] and son in [place]. He was seen in our office 11/04/16 after a near syncopal event after starting a herbal supplement for his bladder. This medication was stopped due to anticholinergic side effects. Echo May 2018 with normal LV function, severe concentric LVH, mild to moderate AS, SAM LVOT gradient not well characterized but peak velocity in the 2.5 m/sec range, overall unchanged.  24 hour cardiac monitor with sinus rhythm, PVCs, no long pauses. He had a syncopal episode in February 2020. He was seen in our office August 2020 by Edward Dopp, PA-C and his beta blocker dose was decreased. Cardiac monitor with several quick bursts of SVT but no pauses or heart block noted.   Last seen by Edward Foster 12/2019.  History of Present Illness:   Edward Foster presented with near syncope episode.  Patient lives by himself.  Daughter visit him every day and has dinner on Wednesday and Saturday.  On March 30 he was in usual state of health when daughter saw him.  She left the house at 7:30 PM.  H&P reported nausea, vomiting and diarrhea for 2 days but daughter denies that.  Yesterday morning neighbor noted that he did not pick up his newspaper and open up window blinds (normal routine).  He  also did not answer his phone.  Neighbor called daughter who gave access through garage.  He was found in his bedroom chair slumped over.  Bruising on forehead.  EMS was called and he was found bradycardic at 47 and hypotensive.  He was orthostatic as well.  He was given 700 mL of fluids.  Upon arrival to emergency room his blood pressure was 104/52 and heart rate of 51.  He reports some weakness right upper extremity.  Missing battery in hearing aid making conversation difficult.  Denies chest pain, orthopnea, PND, lower extremity edema or melena.  BUN/creatinine 26/1.51>>29/1.56 CK total 134 High-sensitivity troponin 262>>283>>273>>237 Lactic acid 1.8 Hemoglobin 12.3>>10.6 TSH normal Chest X-ray low lung volumes with vascular congestion versus bronchovascular Crowding. CT of head without acute intracranial abnormality.  Low-density in the right cerebellar hemisphere may represent subacute infarct or artifact.  Past Medical History:  Diagnosis Date  . Heart murmur   . HOH (hard of hearing)   . Hypertrophic cardiomyopathy ([place])    Echo 5/18: severe conc LVH, EF 50-55, LVOT gradient not well characterized but peak velocity 2.5 m/sec; mild to mod AS (mean 19, peak 33), mild AI, severe post MAC, mod LAE, PFO cannot be excluded  . Hypothyroidism   . Prostate cancer [facility])     Past Surgical History:  Procedure Laterality Date  . SEED IMPLANTS - PROSTATE    . TOTAL HIP ARTHROPLASTY Right 05/29/2019   Procedure: RIGHT TOTAL HIP ARTHROPLASTY ANTERIOR APPROACH;  Surgeon:  Frederik Pear, MD;  Location: [facility];  Service: Orthopedics;  Laterality: Right;    Inpatient Medications: Scheduled Meds: . levothyroxine  125 mcg Oral QAC breakfast   Continuous Infusions: . sodium chloride     PRN Meds: acetaminophen **OR** acetaminophen  Allergies:   No Known Allergies  Social History:   Social History   Socioeconomic History  . Marital status: Widowed    Spouse name: Not on file  . Number of  children: 4  . Years of education: Not on file  . Highest education level: Not on file  Occupational History  . Occupation: Retired  Tobacco Use  . Smoking status: Former Smoker    Years: 20.00    Quit date: 07/19/1973    Years since quitting: 47.2  . Smokeless tobacco: Former Network engineer  . Vaping Use: Never used  Substance and Sexual Activity  . Alcohol use: Yes    Alcohol/week: 8.0 standard drinks    Types: 7 Standard drinks or equivalent, 1 Shots of liquor per week  . Drug use: No  . Sexual activity: Not on file  Other Topics Concern  . Not on file  Social History Narrative  . Not on file   Social Determinants of Health   Financial Resource Strain: Not on file  Food Insecurity: Not on file  Transportation Needs: Not on file  Physical Activity: Not on file  Stress: Not on file  Social Connections: Not on file  Intimate Partner Violence: Not on file    Family History:   Family History  Problem Relation Age of Onset  . Diabetes Mother      ROS:  Please see the history of present illness.  All other ROS reviewed and negative.     Physical Exam/Data:   Vitals:   10/04/20 0830 10/04/20 0930 10/04/20 1000 10/04/20 1030  BP: 124/89 (!) 111/53 110/80 104/61  Pulse: (!) 55 60 75 (!) 57  Resp: _0 (!) 24  Temp:      TempSrc:      SpO2: 95% 96% 93% 95%    Intake/Output Summary (Last 24 hours) at 10/04/2020 1248 Last data filed at 10/04/2020 0019 Gross per 24 hour  Intake 700 ml  Output 200 ml  Net 500 ml   Last 3 Weights 01/03/2020 05/29/2019 05/25/2019  Weight (lbs) 165 lb 164 lb 3.9 oz 166 lb 2 oz  Weight (kg) 74.844 kg 74.5 kg 75.354 kg     There is no height or weight on file to calculate BMI.  General:  Well nourished, well developed, in no acute distress HEENT:trauma on forhead Lymph: no adenopathy Neck: no JVD Endocrine:  No thryomegaly Vascular: No carotid bruits; FA pulses 2+ bilaterally without bruits  Cardiac:  normal S1, S2; slow  regular; + murmur  Lungs:  clear to auscultation bilaterally, no wheezing, rhonchi or rales  Abd: soft, nontender, no hepatomegaly  Ext: no edema Musculoskeletal:  No deformities, BUE and BLE strength normal and equal Skin: warm and dry  Neuro:  CNs 2-12 intact, no focal abnormalities noted Psych:  Normal affect   EKG:  The EKG was personally reviewed and demonstrates:  Sinus bradycardia, HR 47, LBBB, prolonged PR interval  Telemetry:  Telemetry was personally reviewed and demonstrates:  Sinus Bradycardia 40-50s  Relevant CV Studies:  Echo 11/2016 Study Conclusions   - Left ventricle: SAM LVOT gradient not well characterized but peak  velocity in the 2.5 m/sec range. There was severe concentric  hypertrophy.  Systolic function was normal. The estimated ejection  fraction was in the range of 50% to 55%. Doppler parameters are  consistent with abnormal left ventricular relaxation (grade 1  diastolic dysfunction).  - Aortic valve: There was mild to moderate stenosis. There was mild  regurgitation. Valve area (VTI): 2.3 cm^2. Valve area (Vmax):  2.61 cm^2. Valve area (Vmean): 2.59 cm^2.  - Mitral valve: Severe posterior annular calcification. Valve area  by pressure half-time: 2.5 cm^2. Valve area by continuity  equation (using LVOT flow): 3.73 cm^2.  - Left atrium: The atrium was moderately dilated.  - Atrial septum: A patent foramen ovale cannot be excluded.   Laboratory Data:  High Sensitivity Troponin:   Recent Labs  Lab 10/03/20 1612 10/03/20 1812 10/03/20 2300 10/04/20 0518  TROPONINIHS 262* 283* 273* 237*     Chemistry Recent Labs  Lab 10/03/20 1612 10/04/20 0518  NA 136 138  K 4.2 4.0  CL 106 110  CO2 24 20*  GLUCOSE 124* 109*  BUN 26* 29*  CREATININE 1.51* 1.56*  CALCIUM 8.5* 7.9*  GFRNONAA 42* 41*  ANIONGAP 6 8    Recent Labs  Lab 10/03/20 1612 10/04/20 0518  PROT 5.8* 5.1*  ALBUMIN 3.6 3.2*  AST 26 27  ALT 22 21  ALKPHOS 49 42   BILITOT 1.0 0.5   Hematology Recent Labs  Lab 10/03/20 1612 10/04/20 0518  WBC 7.7 8.3  RBC 3.79* 3.17*  HGB 12.3* 10.6*  HCT 37.3* 31.8*  MCV 98.4 100.3*  MCH 32.5 33.4  MCHC 33.0 33.3  RDW 13.5 13.6  PLT 137* 132*   Radiology/Studies:  CT Head Wo Contrast  Result Date: 10/03/2020 CLINICAL DATA:  87 year old post fall.  Abnormal mental status. EXAM: CT HEAD WITHOUT CONTRAST CT CERVICAL SPINE WITHOUT CONTRAST TECHNIQUE: Multidetector CT imaging of the head and cervical spine was performed following the standard protocol without intravenous contrast. Multiplanar CT image reconstructions of the cervical spine were also generated. All images are located in the cervical spine CT in PACS. COMPARISON:  None. FINDINGS: CT HEAD FINDINGS Brain: Age related atrophy. Mild chronic small vessel ischemia. There is an area of low-density involving the right cerebellar hemisphere that may represent subacute infarct. Alternatively a streak artifact from adjacent skull base is considered. No evidence hemorrhage, hydrocephalus, extra-axial collection or mass lesion/mass effect. Vascular: Atherosclerosis of skullbase vasculature without hyperdense vessel or abnormal calcification. Skull: No fracture or focal lesion. Sinuses/Orbits: Scattered mucosal thickening throughout ethmoid air cells as well as both sphenoid sinuses. Mastoid air cells are clear. Bilateral cataract resection. No acute orbital abnormality. Other: None. CT CERVICAL SPINE FINDINGS Alignment: Chest anterolisthesis of C4 on C5 and T1 on T2, likely degenerative. No traumatic subluxation. Skull base and vertebrae: No acute fracture. Vertebral body heights are maintained. The dens and skull base are intact. Prominent multilevel facet hypertrophy. Soft tissues and spinal canal: No prevertebral fluid or swelling. No visible canal hematoma.1 Disc levels: Diffuse degenerative disc disease, prominent involving C5-C6 and C6-C7. prominent multilevel facet  hypertrophy. Partial ankylosis of C3-C4 on the left slightly degenerative. Osteophyte versus osseous projection arising from left C5 facet causes narrowing of the spinal canal. There is multilevel neural foraminal narrowing. Upper chest: No acute or unexpected findings scoliosis of the upper thoracic spine. Other: None. IMPRESSION: 1. No acute intracranial abnormality. No skull fracture. 2. Age related atrophy and chronic small vessel ischemia. 3. Low-density in the right cerebellar hemisphere may represent subacute infarct, or artifact from adjacent skull base. Consider further evaluation  with MRI. 4. Multilevel degenerative change throughout the cervical spine without acute fracture or subluxation. 5. Osteophyte versus osseous projection arising from the left C5 facet causes narrowing of the spinal canal. Electronically Signed   By: Keith Rake M.D.   On: 10/03/2020 17:28   CT Cervical Spine Wo Contrast  Result Date: 10/03/2020 CLINICAL DATA:  87 year old post fall.  Abnormal mental status. EXAM: CT HEAD WITHOUT CONTRAST CT CERVICAL SPINE WITHOUT CONTRAST TECHNIQUE: Multidetector CT imaging of the head and cervical spine was performed following the standard protocol without intravenous contrast. Multiplanar CT image reconstructions of the cervical spine were also generated. All images are located in the cervical spine CT in PACS. COMPARISON:  None. FINDINGS: CT HEAD FINDINGS Brain: Age related atrophy. Mild chronic small vessel ischemia. There is an area of low-density involving the right cerebellar hemisphere that may represent subacute infarct. Alternatively a streak artifact from adjacent skull base is considered. No evidence hemorrhage, hydrocephalus, extra-axial collection or mass lesion/mass effect. Vascular: Atherosclerosis of skullbase vasculature without hyperdense vessel or abnormal calcification. Skull: No fracture or focal lesion. Sinuses/Orbits: Scattered mucosal thickening throughout ethmoid  air cells as well as both sphenoid sinuses. Mastoid air cells are clear. Bilateral cataract resection. No acute orbital abnormality. Other: None. CT CERVICAL SPINE FINDINGS Alignment: Chest anterolisthesis of C4 on C5 and T1 on T2, likely degenerative. No traumatic subluxation. Skull base and vertebrae: No acute fracture. Vertebral body heights are maintained. The dens and skull base are intact. Prominent multilevel facet hypertrophy. Soft tissues and spinal canal: No prevertebral fluid or swelling. No visible canal hematoma.1 Disc levels: Diffuse degenerative disc disease, prominent involving C5-C6 and C6-C7. prominent multilevel facet hypertrophy. Partial ankylosis of C3-C4 on the left slightly degenerative. Osteophyte versus osseous projection arising from left C5 facet causes narrowing of the spinal canal. There is multilevel neural foraminal narrowing. Upper chest: No acute or unexpected findings scoliosis of the upper thoracic spine. Other: None. IMPRESSION: 1. No acute intracranial abnormality. No skull fracture. 2. Age related atrophy and chronic small vessel ischemia. 3. Low-density in the right cerebellar hemisphere may represent subacute infarct, or artifact from adjacent skull base. Consider further evaluation with MRI. 4. Multilevel degenerative change throughout the cervical spine without acute fracture or subluxation. 5. Osteophyte versus osseous projection arising from the left C5 facet causes narrowing of the spinal canal. Electronically Signed   By: Keith Rake M.D.   On: 10/03/2020 17:28   DG Chest Portable 1 View  Result Date: 10/03/2020 CLINICAL DATA:  Shortness of breath.  Syncope at home. EXAM: PORTABLE CHEST 1 VIEW COMPARISON:  05/29/2019 FINDINGS: Lung volumes are low. Upper normal heart size is likely accentuated by portable technique and low lung volumes. Vascular congestion versus bronchovascular crowding. No confluent consolidation. No pneumothorax or large pleural effusion. No  acute osseous abnormalities are seen. IMPRESSION: Low lung volumes with vascular congestion versus bronchovascular crowding. Electronically Signed   By: Keith Rake M.D.   On: 10/03/2020 16:35     Assessment and Plan:   1. Near syncope - Unknown etiology. At baseline he lives independently. He has known conduction disease abnormality with LBBB with prior bradycardia on monitor.  - ? GI illness for past few days. His orthostatics could be due to poor PO intake but unreliable hx.  - TSH normal - Hold Bisoprolol - Wait echo r/o structural abnormality  - Given age, needs to discuss goal of care. Palliative care consult would be appropriate - Possible subacute infract on CT >>  work up per primary   2. Sinus bradycardia with 1st degree AV block - Discontinue Bisoprolol  - He has wore 2 monitor in past. ? ILR candidate but palliative care consult will help   3. Hypertropic cardiomyopathy 4. Aortic stenosis - Wait echo result  5. Abnormal CT - Per primary team   6. Orthostatic hypotension - during PT  10/04/20 1030  Orthostatic Lying   BP- Lying 109/51  Orthostatic Sitting  BP- Sitting 104/61  Orthostatic Standing at 0 minutes  BP- Standing at 0 minutes (!) 82/55  - Fluid support - Compression stockings and abdominal binder - Could consider midodrine     Risk Assessment/Risk Scores:  {  For questions or updates, please contact [facility] Please consult [email or web address] for contact info under    Jarrett Soho, [place]  10/04/2020 12:48 PM    ATTENDING ATTESTATION  I have seen, examined and evaluated the patient this afternoon along with Mr. Curly Shores, [place].  After reviewing all the available data and chart, we discussed the patients laboratory, study & physical findings as well as symptoms in detail. I agree with his findings, examination as well as impression recommendations as per our discussion.    Very pleasant elderly gentleman with hypertension and  hypertrophic cardiomyopathy (not sure if this is true HOCM versus hypertensive hypertrophic cardiomyopathy), but regardless very susceptible to volume depletion.  He was doing fine his usual state of health, actually did yard work this past weekend digging up a stump from the front yard.  He had a nice dinner with his daughter and son-in-law on the evening of Wednesday.  They had a nice dinner with a couple drinks.  He did fine that evening.  However the following morning when he woke up (usually this is roughly 7:00), it was almost 10:00.  He just felt extremely fatigued, worn out and wanted to sleep longer.  About an hour and a half later he got up and as he remembers, he went into the closet where he sits in a chair to put his clothes on.  He was sitting there when his neighbors came to check on him.  They apparently have a signal of looking at his blinds or whether he picks up his newspaper on time in the morning as to whether or not he is something going on.  When they did not see either of these signs, they contacted his daughter, and use the garage could come and check on him.  They found him per his report, sitting in the chair having put his shoes on, but according to the report in the chart, he was unresponsive.  This may have simply been because his hearing aids were not in.  Somehow, however he  While seated on the chair had probably fallen forward because he has a contusion and scrape on his forehead indicating that he probably landed on his head.  The daughter also indicates that when she came over to check they found that he clearly had a large  liquid bowel movement concerning for diarrhea.  He does not remember that at all.  He probably also did not drink enough water as result of having some alcoholic beverages with the previous meal.  The daughter also indicates that he usually does not do well with drinking enough or eating enough during the course the day.   To summarize the course of  events that led to his likely syncopal event, I suspect, that he at baseline is somewhat dehydrated  and is extremely susceptible to having syncope with hypertrophic cardiomyopathy.  He was likely dehydrated, exacerbated by the fact that he had a large bowel movement.  He does not recall having out of the bathroom, but likely he had some type of GI bug or something from what he ate that did not sit right.  That probably explains why he did not feel well in the morning.  He was not feeling well, likely very dehydrated, got up to do his normal routine albeit several hours later.  He probably sat down in the chair bent forward to put his shoes on and fell forward.  Somehow he was coherent enough to get back up on the chair, where he says he fell asleep.  There is no true documentation that he actually had loss of consciousness.  The only indication that he may have had a loss consciousness is that he has this scrape on his forehead indicating that he possibly passed out.  He denies any arrhythmia symptoms. Overall I totally agree with the consult appreciated.  Aggressive hydration.  I do not know that studies such as carotid Dopplers and echocardiogram will help very much.  Echocardiogram may be beneficial just to show the extent of LVH. I agree with the concept of abdominal binder and compression stockings as well as aggressive hydration.  He also needs to have enough nutrition for occluded properties to hold onto fluid.  Would avoid diuretics.  I do not think this is arrhythmia genic, although he was somewhat bradycardic.  Therefore I do agree with stopping the beta-blocker.  It is possible in the hypertrophic heart that is volume depleted that he could have some compaction related arrhythmia.  Agree with antibiotic.  I will follow will follow along.     Glenetta Hew, M.D., M.S. Interventional Cardiologist   Pager # [phone] Phone # [phone] [address] [place], [place]  [id number]

## 2020-10-04 NOTE — Evaluation (Signed)
Physical Therapy Evaluation Patient Details Name: Edward Foster MRN: [id number] DOB: 14-Aug-1922 Today's Date: 10/04/2020   History of Present Illness  Pt is a 87 y/o male admitted secondary to syncopal episode. Pt also with nausea/vomiting/diarrhea prior to admission. PMH includes R THA and prostate cancer.  Clinical Impression  Pt admitted secondary to problem above with deficits below. Pt requiring min guard A for transfers this session. Pt with + orthostatics, so further mobility deferred. Pt very active prior to admission, and anticipate pt will progress well once symptoms improve. Will continue to follow acutely.     10/04/20 1030  Orthostatic Lying   BP- Lying 109/51  Orthostatic Sitting  BP- Sitting 104/61  Orthostatic Standing at 0 minutes  BP- Standing at 0 minutes (!) 82/55      Follow Up Recommendations Home health PT;Supervision for mobility/OOB (pending progression)    Equipment Recommendations  None recommended by PT    Recommendations for Other Services       Precautions / Restrictions Precautions Precautions: Fall Restrictions Weight Bearing Restrictions: No      Mobility  Bed Mobility Overal bed mobility: Needs Assistance Bed Mobility: Supine to Sit;Sit to Supine     Supine to sit: Supervision Sit to supine: Supervision   General bed mobility comments: Supervision for safety.    Transfers Overall transfer level: Needs assistance Equipment used: None Transfers: Sit to/from Stand Sit to Stand: Min guard         General transfer comment: Min guard for safety. Pt with + orthostatics, so further mobility deferred.  Ambulation/Gait                Stairs            Wheelchair Mobility    Modified Rankin (Stroke Patients Only)       Balance Overall balance assessment: Needs assistance Sitting-balance support: No upper extremity supported;Feet supported Sitting balance-Leahy Scale: Good     Standing balance support: No  upper extremity supported;During functional activity Standing balance-Leahy Scale: Fair                               Pertinent Vitals/Pain Pain Assessment: Faces Faces Pain Scale: No hurt    Home Living Family/patient expects to be discharged to:: Private residence Living Arrangements: Alone Available Help at Discharge: Family Type of Home: House Home Access: Level entry     Home Layout: One level Home Equipment: Environmental consultant - 2 wheels;Shower seat      Prior Function Level of Independence: Independent         Comments: Pt's daughter reports pt is very independent and was very active prior to admission. Pt does not drive; daughter drives him where he needs to go.     Hand Dominance        Extremity/Trunk Assessment   Upper Extremity Assessment Upper Extremity Assessment: Defer to OT evaluation    Lower Extremity Assessment Lower Extremity Assessment: Generalized weakness    Cervical / Trunk Assessment Cervical / Trunk Assessment: Normal  Communication   Communication: HOH  Cognition Arousal/Alertness: Awake/alert Behavior During Therapy: WFL for tasks assessed/performed Overall Cognitive Status: Impaired/Different from baseline Area of Impairment: Problem solving                             Problem Solving: Slow processing General Comments: Pt's daughter present. Reports he is normally very with it. Mild  slow processing noted and pt somewhat tangential.      General Comments General comments (skin integrity, edema, etc.): Pt's daughter present during session. BP in supine 109/51; sitting 104/61; standing 82/55; return to supine 129/41. RN notified.    Exercises     Assessment/Plan    PT Assessment Patient needs continued PT services  PT Problem List Decreased strength;Decreased balance;Decreased activity tolerance;Decreased mobility;Decreased knowledge of precautions;Cardiopulmonary status limiting activity       PT Treatment  Interventions Gait training;DME instruction;Functional mobility training;Therapeutic activities;Therapeutic exercise;Balance training;Patient/family education    PT Goals (Current goals can be found in the Care Plan section)  Acute Rehab PT Goals Patient Stated Goal: to go home PT Goal Formulation: With patient/family Time For Goal Achievement: 10/18/20 Potential to Achieve Goals: Good    Frequency Min 3X/week   Barriers to discharge        Co-evaluation               AM-PAC PT "6 Clicks" Mobility  Outcome Measure Help needed turning from your back to your side while in a flat bed without using bedrails?: None Help needed moving from lying on your back to sitting on the side of a flat bed without using bedrails?: None Help needed moving to and from a bed to a chair (including a wheelchair)?: A Little Help needed standing up from a chair using your arms (e.g., wheelchair or bedside chair)?: A Little Help needed to walk in hospital room?: A Little Help needed climbing 3-5 steps with a railing? : A Lot 6 Click Score: 19    End of Session Equipment Utilized During Treatment: Gait belt Activity Tolerance: Treatment limited secondary to medical complications (Comment) (+ orthostatics) Patient left: in bed;with call bell/phone within reach;with family/visitor present (on bed in ED) Nurse Communication: Mobility status PT Visit Diagnosis: Unsteadiness on feet (R26.81);Muscle weakness (generalized) (M62.81)    Time: 2585-2778 PT Time Calculation (min) (ACUTE ONLY): 18 min   Charges:   PT Evaluation $PT Eval Moderate Complexity: 1 Mod          Reuel Derby, PT, DPT  Acute Rehabilitation Services  Pager: [phone] Office: [phone]   Edward Foster 10/04/2020, 12:01 PM

## 2020-10-04 NOTE — Plan of Care (Signed)

## 2020-10-04 NOTE — Progress Notes (Signed)
PROGRESS NOTE  Brason Berthelot TKP:[id number] DOB: 08-07-1922 DOA: 10/03/2020 PCP: Velna Hatchet, MD   LOS: 1 day   Brief narrative:  Ronne Stefanski is a 87 y.o. male with medical history significant of hypertrophic cardiomyopathy, hypothyroidism, prostate cancer s/p brachytherapy and XRT, LBBB who presented to hospital with syncopal episode.  Patient has had nausea vomiting diarrhea for 2 days and was apparently feeling very weak.  He had recently tried over-the-counter medication to control his bladder symptoms.  Recently his beta-blocker was decreased as outpatient.  EMS was called and patient was noted to be bradycardic and hypotensive with heart rate around 45.  He was given around 700 mL of fluid and was brought into the hospital.    Assessment/Plan:  Active Problems:   Hypertrophic obstructive cardiomyopathy (HCC)   Acute metabolic encephalopathy   Stroke-like symptoms   Elevated troponin   Fall at home, initial encounter   AKI (acute kidney injury) ([place])   Dehydration   Syncope likely secondary to orthostatic hypotension/ volume depletion leading to fall.  Seen by cardiology.  Off beta-blockers at this time.  Continue to monitor closely.  Patient was strongly advised regarding orthostatic precautions elastic compression hose.  Might need midodrine if remains orthostatic.  CT head showed possible subacute stroke.  Physical therapy occupational therapy has been consulted.  Check 2D echocardiogram.  No plans for MRI after discussing with the family as this would not change much in the treatment.  Check orthostatic blood pressure in a.m.  Continue gentle IV fluid hydration overnight.  History of Hypertrophic obstructive cardiomyopathy.  Telemetry monitor.  Cardiology on board.  Check 2D echocardiogram to assess for current situation.  Acute metabolic encephalopathy-secondary to volume depletion.  Unlikely to be infected.  Continue IV fluid hydration tonight.  Check orthostatic blood  pressure in a.m.    Marland Kitchen Elevated troponin -denies any chest pain.  EKG unchanged.  Check echocardiogram.  Cardiology on board.    Nausea vomiting and diarrhea.  Has resolved at this time.  Possible acute kidney injury.  Present on admission.  Creatinine of 1.5 at this time. Creatinine of 1.3 one-year back.  BMP in AM.  Bradycardia/hypotension bisoprolol on hold.   DVT prophylaxis: SCDs Start: 10/03/20 2019   Code Status: DNR  Family Communication:  Spoke with the patient's daughter at bedside.  Status is: Inpatient  Remains inpatient appropriate because:IV treatments appropriate due to intensity of illness or inability to take PO and Inpatient level of care appropriate due to severity of illness   Dispo: The patient is from: Home              Anticipated d/c is to: Home with home health              Patient currently is not medically stable to d/c.   Difficult to place patient No  Consultants:  Cardiology  Procedures:  None  Anti-infectives:  . None  Anti-infectives (From admission, onward)   None     Subjective: Today, patient was seen and examined at bedside.  At the time of my exam, patient denies any nausea vomiting dizziness lightheadedness shortness of breath.  Patient's daughter at bedside.  Patient was very orthostatic during PT evaluation.  Objective: Vitals:   10/04/20 1400 10/04/20 1401  BP: (!) 116/51   Pulse: 61   Resp: (!) 22   Temp:  98.2 F (36.8 C)  SpO2: 98%     Intake/Output Summary (Last 24 hours) at 10/04/2020 1424 Last data filed at  10/04/2020 0019 Gross per 24 hour  Intake 700 ml  Output 200 ml  Net 500 ml   There were no vitals filed for this visit. There is no height or weight on file to calculate BMI.   Physical Exam: GENERAL: Patient is alert awake and oriented. Not in obvious distress.  Mild abrasion over the right forehead.  Mild hard of hearing. HENT: No scleral pallor or icterus. Pupils equally reactive to light.  Oral mucosa is moist NECK: is supple, no gross swelling noted. CHEST: Clear to auscultation. No crackles or wheezes.  Diminished breath sounds bilaterally. CVS: S1 and S2 heard, murmur noted. Regular rate and rhythm.  ABDOMEN: Soft, non-tender, bowel sounds are present. EXTREMITIES: No edema.  Right knee Abrasion CNS: Cranial nerves are intact. No focal motor deficits. SKIN: warm and dry without rashes.  Data Review: I have personally reviewed the following laboratory data and studies,  CBC: Recent Labs  Lab 10/03/20 1612 10/04/20 0518  WBC 7.7 8.3  NEUTROABS 6.9 6.5  HGB 12.3* 10.6*  HCT 37.3* 31.8*  MCV 98.4 100.3*  PLT 137* 938*   Basic Metabolic Panel: Recent Labs  Lab 10/03/20 1612 10/03/20 2105 10/04/20 0518  NA 136  --  138  K 4.2  --  4.0  CL 106  --  110  CO2 24  --  20*  GLUCOSE 124*  --  109*  BUN 26*  --  29*  CREATININE 1.51*  --  1.56*  CALCIUM 8.5*  --  7.9*  MG  --  2.0 1.9  PHOS  --  3.9 3.5   Liver Function Tests: Recent Labs  Lab 10/03/20 1612 10/04/20 0518  AST 26 27  ALT 22 21  ALKPHOS 49 42  BILITOT 1.0 0.5  PROT 5.8* 5.1*  ALBUMIN 3.6 3.2*   No results for input(s): LIPASE, AMYLASE in the last 168 hours. No results for input(s): AMMONIA in the last 168 hours. Cardiac Enzymes: Recent Labs  Lab 10/03/20 1612  CKTOTAL 134   BNP (last 3 results) No results for input(s): BNP in the last 8760 hours.  ProBNP (last 3 results) No results for input(s): PROBNP in the last 8760 hours.  CBG: Recent Labs  Lab 10/03/20 2110  GLUCAP 112*   Recent Results (from the past 240 hour(s))  Resp Panel by RT-PCR (Flu A&B, Covid) Nasopharyngeal Swab     Status: None   Collection Time: 10/03/20  4:33 PM   Specimen: Nasopharyngeal Swab; Nasopharyngeal(NP) swabs in vial transport medium  Result Value Ref Range Status   SARS Coronavirus 2 by RT PCR NEGATIVE NEGATIVE Final    Comment: (NOTE) SARS-CoV-2 target nucleic acids are NOT  DETECTED.  The SARS-CoV-2 RNA is generally detectable in upper respiratory specimens during the acute phase of infection. The lowest concentration of SARS-CoV-2 viral copies this assay can detect is 138 copies/mL. A negative result does not preclude SARS-Cov-2 infection and should not be used as the sole basis for treatment or other patient management decisions. A negative result may occur with  improper specimen collection/handling, submission of specimen other than nasopharyngeal swab, presence of viral mutation(s) within the areas targeted by this assay, and inadequate number of viral copies(<138 copies/mL). A negative result must be combined with clinical observations, patient history, and epidemiological information. The expected result is Negative.  Fact Sheet for Patients:  EntrepreneurPulse.com.au  Fact Sheet for Healthcare Providers:  IncredibleEmployment.be  This test is no t yet approved or cleared by the [place]  FDA and  has been authorized for detection and/or diagnosis of SARS-CoV-2 by FDA under an Emergency Use Authorization (EUA). This EUA will remain  in effect (meaning this test can be used) for the duration of the COVID-19 declaration under Section 564(b)(1) of the Act, 21 U.S.C.section 360bbb-3(b)(1), unless the authorization is terminated  or revoked sooner.       Influenza A by PCR NEGATIVE NEGATIVE Final   Influenza B by PCR NEGATIVE NEGATIVE Final    Comment: (NOTE) The Xpert Xpress SARS-CoV-2/FLU/RSV plus assay is intended as an aid in the diagnosis of influenza from Nasopharyngeal swab specimens and should not be used as a sole basis for treatment. Nasal washings and aspirates are unacceptable for Xpert Xpress SARS-CoV-2/FLU/RSV testing.  Fact Sheet for Patients: EntrepreneurPulse.com.au  Fact Sheet for Healthcare Providers: IncredibleEmployment.be  This test is not yet  approved or cleared by the [place] FDA and has been authorized for detection and/or diagnosis of SARS-CoV-2 by FDA under an Emergency Use Authorization (EUA). This EUA will remain in effect (meaning this test can be used) for the duration of the COVID-19 declaration under Section 564(b)(1) of the Act, 21 U.S.C. section 360bbb-3(b)(1), unless the authorization is terminated or revoked.  Performed at [facility], [place] [address] [place], [place] [address]      Studies: CT Head Wo Contrast  Result Date: 10/03/2020 CLINICAL DATA:  87 year old post fall.  Abnormal mental status. EXAM: CT HEAD WITHOUT CONTRAST CT CERVICAL SPINE WITHOUT CONTRAST TECHNIQUE: Multidetector CT imaging of the head and cervical spine was performed following the standard protocol without intravenous contrast. Multiplanar CT image reconstructions of the cervical spine were also generated. All images are located in the cervical spine CT in PACS. COMPARISON:  None. FINDINGS: CT HEAD FINDINGS Brain: Age related atrophy. Mild chronic small vessel ischemia. There is an area of low-density involving the right cerebellar hemisphere that may represent subacute infarct. Alternatively a streak artifact from adjacent skull base is considered. No evidence hemorrhage, hydrocephalus, extra-axial collection or mass lesion/mass effect. Vascular: Atherosclerosis of skullbase vasculature without hyperdense vessel or abnormal calcification. Skull: No fracture or focal lesion. Sinuses/Orbits: Scattered mucosal thickening throughout ethmoid air cells as well as both sphenoid sinuses. Mastoid air cells are clear. Bilateral cataract resection. No acute orbital abnormality. Other: None. CT CERVICAL SPINE FINDINGS Alignment: Chest anterolisthesis of C4 on C5 and T1 on T2, likely degenerative. No traumatic subluxation. Skull base and vertebrae: No acute fracture. Vertebral body heights are maintained. The dens and skull base are intact.  Prominent multilevel facet hypertrophy. Soft tissues and spinal canal: No prevertebral fluid or swelling. No visible canal hematoma.1 Disc levels: Diffuse degenerative disc disease, prominent involving C5-C6 and C6-C7. prominent multilevel facet hypertrophy. Partial ankylosis of C3-C4 on the left slightly degenerative. Osteophyte versus osseous projection arising from left C5 facet causes narrowing of the spinal canal. There is multilevel neural foraminal narrowing. Upper chest: No acute or unexpected findings scoliosis of the upper thoracic spine. Other: None. IMPRESSION: 1. No acute intracranial abnormality. No skull fracture. 2. Age related atrophy and chronic small vessel ischemia. 3. Low-density in the right cerebellar hemisphere may represent subacute infarct, or artifact from adjacent skull base. Consider further evaluation with MRI. 4. Multilevel degenerative change throughout the cervical spine without acute fracture or subluxation. 5. Osteophyte versus osseous projection arising from the left C5 facet causes narrowing of the spinal canal. Electronically Signed   By: Keith Rake M.D.   On: 10/03/2020 17:28   CT  Cervical Spine Wo Contrast  Result Date: 10/03/2020 CLINICAL DATA:  87 year old post fall.  Abnormal mental status. EXAM: CT HEAD WITHOUT CONTRAST CT CERVICAL SPINE WITHOUT CONTRAST TECHNIQUE: Multidetector CT imaging of the head and cervical spine was performed following the standard protocol without intravenous contrast. Multiplanar CT image reconstructions of the cervical spine were also generated. All images are located in the cervical spine CT in PACS. COMPARISON:  None. FINDINGS: CT HEAD FINDINGS Brain: Age related atrophy. Mild chronic small vessel ischemia. There is an area of low-density involving the right cerebellar hemisphere that may represent subacute infarct. Alternatively a streak artifact from adjacent skull base is considered. No evidence hemorrhage, hydrocephalus,  extra-axial collection or mass lesion/mass effect. Vascular: Atherosclerosis of skullbase vasculature without hyperdense vessel or abnormal calcification. Skull: No fracture or focal lesion. Sinuses/Orbits: Scattered mucosal thickening throughout ethmoid air cells as well as both sphenoid sinuses. Mastoid air cells are clear. Bilateral cataract resection. No acute orbital abnormality. Other: None. CT CERVICAL SPINE FINDINGS Alignment: Chest anterolisthesis of C4 on C5 and T1 on T2, likely degenerative. No traumatic subluxation. Skull base and vertebrae: No acute fracture. Vertebral body heights are maintained. The dens and skull base are intact. Prominent multilevel facet hypertrophy. Soft tissues and spinal canal: No prevertebral fluid or swelling. No visible canal hematoma.1 Disc levels: Diffuse degenerative disc disease, prominent involving C5-C6 and C6-C7. prominent multilevel facet hypertrophy. Partial ankylosis of C3-C4 on the left slightly degenerative. Osteophyte versus osseous projection arising from left C5 facet causes narrowing of the spinal canal. There is multilevel neural foraminal narrowing. Upper chest: No acute or unexpected findings scoliosis of the upper thoracic spine. Other: None. IMPRESSION: 1. No acute intracranial abnormality. No skull fracture. 2. Age related atrophy and chronic small vessel ischemia. 3. Low-density in the right cerebellar hemisphere may represent subacute infarct, or artifact from adjacent skull base. Consider further evaluation with MRI. 4. Multilevel degenerative change throughout the cervical spine without acute fracture or subluxation. 5. Osteophyte versus osseous projection arising from the left C5 facet causes narrowing of the spinal canal. Electronically Signed   By: Keith Rake M.D.   On: 10/03/2020 17:28   DG Chest Portable 1 View  Result Date: 10/03/2020 CLINICAL DATA:  Shortness of breath.  Syncope at home. EXAM: PORTABLE CHEST 1 VIEW COMPARISON:   05/29/2019 FINDINGS: Lung volumes are low. Upper normal heart size is likely accentuated by portable technique and low lung volumes. Vascular congestion versus bronchovascular crowding. No confluent consolidation. No pneumothorax or large pleural effusion. No acute osseous abnormalities are seen. IMPRESSION: Low lung volumes with vascular congestion versus bronchovascular crowding. Electronically Signed   By: Keith Rake M.D.   On: 10/03/2020 16:35     Flora Lipps, MD  [facility] 10/04/2020  If 7PM-7AM, please contact night-coverage

## 2020-10-05 ENCOUNTER — Inpatient Hospital Stay (HOSPITAL_COMMUNITY): Payer: PPO

## 2020-10-05 DIAGNOSIS — R001 Bradycardia, unspecified: Secondary | ICD-10-CM

## 2020-10-05 DIAGNOSIS — R55 Syncope and collapse: Secondary | ICD-10-CM

## 2020-10-05 DIAGNOSIS — I951 Orthostatic hypotension: Secondary | ICD-10-CM

## 2020-10-05 DIAGNOSIS — R7989 Other specified abnormal findings of blood chemistry: Secondary | ICD-10-CM

## 2020-10-05 LAB — CBC
HCT: 32.3 % — ABNORMAL LOW (ref 39.0–52.0)
Hemoglobin: 10.7 g/dL — ABNORMAL LOW (ref 13.0–17.0)
MCH: 32.4 pg (ref 26.0–34.0)
MCHC: 33.1 g/dL (ref 30.0–36.0)
MCV: 97.9 fL (ref 80.0–100.0)
Platelets: 118 10*3/uL — ABNORMAL LOW (ref 150–400)
RBC: 3.3 MIL/uL — ABNORMAL LOW (ref 4.22–5.81)
RDW: 13.8 % (ref 11.5–15.5)
WBC: 9.4 10*3/uL (ref 4.0–10.5)
nRBC: 0 % (ref 0.0–0.2)

## 2020-10-05 LAB — BASIC METABOLIC PANEL
Anion gap: 6 (ref 5–15)
BUN: 22 mg/dL (ref 8–23)
CO2: 22 mmol/L (ref 22–32)
Calcium: 7.9 mg/dL — ABNORMAL LOW (ref 8.9–10.3)
Chloride: 108 mmol/L (ref 98–111)
Creatinine, Ser: 1.34 mg/dL — ABNORMAL HIGH (ref 0.61–1.24)
GFR, Estimated: 49 mL/min — ABNORMAL LOW (ref >60)
Glucose, Bld: 100 mg/dL — ABNORMAL HIGH (ref 70–99)
Potassium: 3.6 mmol/L (ref 3.5–5.1)
Sodium: 136 mmol/L (ref 135–145)

## 2020-10-05 LAB — ECHOCARDIOGRAM COMPLETE
AV Mean grad: 21 mmHg
AV Peak grad: 38.4 mmHg
Ao pk vel: 3.1 m/s
Area-P 1/2: 4.39 cm2
Height: 71 in
S' Lateral: 2.9 cm
Weight: 2783.09 oz

## 2020-10-05 LAB — PHOSPHORUS: Phosphorus: 2.5 mg/dL (ref 2.5–4.6)

## 2020-10-05 LAB — MAGNESIUM: Magnesium: 1.7 mg/dL (ref 1.7–2.4)

## 2020-10-05 MED ORDER — MIDODRINE HCL 5 MG PO TABS: 5.0000 mg | ORAL_TABLET | Freq: Two times a day (BID) | ORAL | Status: DC

## 2020-10-05 NOTE — Discharge Instructions (Addendum)
Bradycardia, Adult Bradycardia is a slower-than-normal heartbeat. A normal resting heart rate for an adult ranges from 60 to 100 beats per minute. With bradycardia, the resting heart rate is less than 60 beats per minute. Bradycardia can prevent enough oxygen from reaching certain areas of your body when you are active. It can be serious if it keeps enough oxygen from reaching your brain and other parts of your body. Bradycardia is not a problem for everyone. For some healthy adults, a slow resting heart rate is normal. What are the causes? This condition may be caused by:  A problem with the heart, including: ? A problem with the heart's electrical system, such as a heart block. With a heart block, electrical signals between the chambers of the heart are partially or completely blocked, so they are not able to work as they should. ? A problem with the heart's natural pacemaker (sinus node). ? Heart disease. ? A heart attack. ? Heart damage. ? Lyme disease. ? A heart infection. ? A heart condition that is present at birth (congenital heart defect).  Certain medicines that treat heart conditions.  Certain conditions, such as hypothyroidism and obstructive sleep apnea.  Problems with the balance of chemicals and other substances, like potassium, in the blood.  Trauma.  Radiation therapy.   What increases the risk? You are more likely to develop this condition if you:  Are age 65 or older.  Have high blood pressure (hypertension), high cholesterol (hyperlipidemia), or diabetes.  Drink heavily, use tobacco or nicotine products, or use drugs. What are the signs or symptoms? Symptoms of this condition include:  Light-headedness.  Feeling faint or fainting.  Fatigue and weakness.  Trouble with activity or exercise.  Shortness of breath.  Chest pain (angina).  Drowsiness.  Confusion.  Dizziness. How is this diagnosed? This condition may be diagnosed based on:  Your  symptoms.  Your medical history.  A physical exam. During the exam, your health care provider will listen to your heartbeat and check your pulse. To confirm the diagnosis, your health care provider may order tests, such as:  Blood tests.  An electrocardiogram (ECG). This test records the heart's electrical activity. The test can show how fast your heart is beating and whether the heartbeat is steady.  A test in which you wear a portable device (event recorder or Holter monitor) to record your heart's electrical activity while you go about your day.  Anexercise test. How is this treated? Treatment for this condition depends on the cause of the condition and how severe your symptoms are. Treatment may involve:  Treatment of the underlying condition.  Changing your medicines or how much medicine you take.  Having a small, battery-operated device called a pacemaker implanted under the skin. When bradycardia occurs, this device can be used to increase your heart rate and help your heart beat in a regular rhythm. Follow these instructions at home: Lifestyle  Manage any health conditions that contribute to bradycardia as told by your health care provider.  Follow a heart-healthy diet. A nutrition specialist (dietitian) can help educate you about healthy food options and changes.  Follow an exercise program that is approved by your health care provider.  Maintain a healthy weight.  Try to reduce or manage your stress, such as with yoga or meditation. If you need help reducing stress, ask your health care provider.  Do not use any products that contain nicotine or tobacco, such as cigarettes, e-cigarettes, and chewing tobacco. If you need   help quitting, ask your health care provider.  Do not use illegal drugs.  Limit alcohol intake to no more than 1 drink a day for nonpregnant women and 2 drinks a day for men. Be aware of how much alcohol is in your drink. In the [place] one drink equals  one 12 oz bottle of beer (355 mL), one 5 oz glass of wine (148 mL), or one 1 oz glass of hard liquor (44 mL).   General instructions  Take over-the-counter and prescription medicines only as told by your health care provider.  Keep all follow-up visits as told by your health care provider. This is important. How is this prevented? In some cases, bradycardia may be prevented by:  Treating underlying medical problems.  Stopping behaviors or medicines that can trigger the condition. Contact a health care provider if you:  Feel light-headed or dizzy.  Almost faint.  Feel weak or are easily fatigued during physical activity.  Experience confusion or have memory problems. Get help right away if:  You faint.  You have: ? An irregular heartbeat (palpitations). ? Chest pain. ? Trouble breathing. Summary  Bradycardia is a slower-than-normal heartbeat. With bradycardia, the resting heart rate is less than 60 beats per minute.  Treatment for this condition depends on the cause.  Manage any health conditions that contribute to bradycardia as told by your health care provider.  Do not use any products that contain nicotine or tobacco, such as cigarettes, e-cigarettes, and chewing tobacco, and limit alcohol intake.  Keep all follow-up visits as told by your health care provider. This is important. This information is not intended to replace advice given to you by your health care provider. Make sure you discuss any questions you have with your health care provider. Document Revised: 01/03/2018 Document Reviewed: 12/01/2017 Elsevier Patient Education  2021 [employer].   Syncope Syncope is when you pass out (faint) for a short time. It is caused by a sudden decrease in blood flow to the brain. Signs that you may be about to pass out include:  Feeling dizzy or light-headed.  Feeling sick to your stomach (nauseous).  Seeing all white or all black.  Having cold, clammy skin. If  you pass out, get help right away. Call your local emergency services (911 in the [place] Do not drive yourself to the hospital. Follow these instructions at home: Watch for any changes in your symptoms. Take these actions to stay safe and help with your symptoms: Lifestyle  Do not drive, use machinery, or play sports until your doctor says it is okay.  Do not drink alcohol.  Do not use any products that contain nicotine or tobacco, such as cigarettes and e-cigarettes. If you need help quitting, ask your doctor.  Drink enough fluid to keep your pee (urine) pale yellow. General instructions  Take over-the-counter and prescription medicines only as told by your doctor.  If you are taking blood pressure or heart medicine, sit up and stand up slowly. Spend a few minutes getting ready to sit and then stand. This can help you feel less dizzy.  Have someone stay with you until you feel stable.  If you start to feel like you might pass out, lie down right away and raise (elevate) your feet above the level of your heart. Breathe deeply and steadily. Wait until all of the symptoms are gone.  Keep all follow-up visits as told by your doctor. This is important. Get help right away if:  You have a  very bad headache.  You pass out once or more than once.  You have pain in your chest, belly, or back.  You have a very fast or uneven heartbeat (palpitations).  It hurts to breathe.  You are bleeding from your mouth or your bottom (rectum).  You have black or tarry poop (stool).  You have jerky movements that you cannot control (seizure).  You are confused.  You have trouble walking.  You are very weak.  You have vision problems. These symptoms may be an emergency. Do not wait to see if the symptoms will go away. Get medical help right away. Call your local emergency services (911 in the [place] Do not drive yourself to the hospital. Summary  Syncope is when you pass out (faint) for a  short time. It is caused by a sudden decrease in blood flow to the brain.  Signs that you may be about to faint include feeling dizzy, light-headed, or sick to your stomach, seeing all white or all black, or having cold, clammy skin.  If you start to feel like you might pass out, lie down right away and raise (elevate) your feet above the level of your heart. Breathe deeply and steadily. Wait until all of the symptoms are gone. This information is not intended to replace advice given to you by your health care provider. Make sure you discuss any questions you have with your health care provider. Document Revised: 08/02/2019 Document Reviewed: 08/04/2017 Elsevier Patient Education  2021 [employer].

## 2020-10-05 NOTE — Progress Notes (Signed)
Pt's IV removed; site is clean, dry and intact. Discharge instructions reviewed with pt and pt's daughter, Stanton Kidney; voiced understanding. Personal belongings were packed by pt's family and transported with pt. TED hose placed on pt. before d/c. Pt. transported via wheelchair to private vehicle with daughter and son. Will follow up with CM/CSW tomorrow to set up HHPT and OT as CSW had already left for the day.

## 2020-10-05 NOTE — Progress Notes (Signed)
  Echocardiogram 2D Echocardiogram has been performed.  Merrie Roof F 10/05/2020, 1:59 PM

## 2020-10-05 NOTE — Discharge Summary (Signed)
Discharge Summary  Edward Foster XAJ:[id number] DOB: 25-Sep-1922  PCP: Velna Hatchet, MD  Admit date: 10/03/2020 Discharge date: 10/05/2020  Time spent: 35 minutes   Recommendations for Outpatient Follow-up:  1. Follow-up with cardiology within a week 2. Follow-up with your primary care provider in 1 to 2 weeks. 3. Take your medications as prescribed 4. Continue PT OT with assistance and fall precautions. 5. Fall precautions. 6. Continue TED hose for orthostatic hypotension. 7. Avoid dehydration. 8. Salt liberation as recommended by cardiology.  Discharge Diagnoses:  Active Hospital Problems   Diagnosis Date Noted  . Acute metabolic encephalopathy 09/47/0962  . Stroke-like symptoms 10/03/2020  . Elevated troponin 10/03/2020  . Fall at home, initial encounter 10/03/2020  . AKI (acute kidney injury) ([facility]) 10/03/2020  . Dehydration 10/03/2020  . Hypertrophic obstructive cardiomyopathy ([facility] [facility]) 07/19/2012    Resolved Hospital Problems  No resolved problems to display.    Discharge Condition: Stable.  Diet recommendation: Avoid dehydration, start liberation as recommended by cardiology.  Vitals:   10/05/20 1638 10/05/20 1720  BP: (!) 124/47 (!) 137/48  Pulse: (!) 56 (!) 54  Resp: 18   Temp: 99.4 F (37.4 C)   SpO2: 94%     History of present illness:  Edward Foster a 87 y.o.malewith medical history significant of hypertrophic cardiomyopathy, hypothyroidism,prostate cancer s/p brachytherapy and XRT,LBBB who presented to hospital with syncopal episode.  Patient has had nausea vomiting diarrhea for 2 days and was apparently feeling very weak.  He had recently tried over-the-counter medication to control his bladder symptoms.  Recently his beta-blocker was decreased as outpatient.  EMS was called and patient was noted to be bradycardic and hypotensive with heart rate around 45.  He was given around 700 mL of fluid and was brought into the hospital for further  evaluation. He was seen by cardiology and had a 2D echo completed.  2D echo done on 10/05/20 revealed LVEF 50 to 55% the left ventricle had no regional wall motion abnormalities.  There is severe concentric left ventricular hypertrophy.  The mitral valve is degenerative.  Moderate to severe mitral valve regurgitation.  Severe mitral annular calcification.  Moderately increased right ventricular wall thickness.  Left atrial size was severely dilated.  Tricuspid valve regurgitation is severe.  There is severe calcification of the aortic valve.  Mild to moderate aortic valve stenosis.  Significant increase in LVOT obstruction, MRI is increased from prior, left atrium is further dilated.  10/05/20: Patient was seen and examined at his bedside.  He denies having any chest pain, dyspnea or palpitation at rest.  Denies dizziness while lying in bed.  He was seen by speech therapist with recommendation for dysphagia 3 diet with thin liquids.  He was seen by PT with recommendation for home health PT. TOC consulted to assist with home health services arrangement.  Hospital Course:  Active Problems:   Hypertrophic obstructive cardiomyopathy (HCC)   Acute metabolic encephalopathy   Stroke-like symptoms   Elevated troponin   Fall at home, initial encounter   AKI (acute kidney injury) ([place])   Dehydration  Syncope likely secondary to orthostatic hypotension/ volume depletion leading to fall.   Seen by cardiology.   Home bisoprolol on hold as recommended by cardiology.   Avoid antihypertensives  Avoid dehydration TED hose as recommended by cardiology. CT head showed possible subacute stroke.  Physical therapy occupational therapy has been consulted.    2D echo results listed above. Per Dr. Louanne Belton" no plans for MRI after discussing with the  family as this would not change much in the treatment."   Follow-up with cardiology within a week.  History ofHypertrophic obstructive cardiomyopathy.  Okay to discharge  per cardiology. 2D echo findings as stated above. Follow-up with cardiology within a week.  Resolved acute metabolic encephalopathy likely-secondary to volume depletion.  Avoid dehydration.  Elevated troponin likely demand ischemia Denies any chest pain.   EKG unchanged.  No wall motion abnormalities on 2D echo. Seen by cardiology.  Resolved nausea vomiting and diarrhea.  CKD 3B Appears to be at his baseline. Avoid nephrotoxic agents Follow-up with your PCP.  Bradycardia/hypotension continue to hold off on bisoprolol as recommended by cardiology.  Hypothyroidism Continue home levothyroxine  Dysphagia Seen by speech therapist with recommendation for dysphagia 3 (soft diet) with thin liquid. Home meds with liquid. Mild aspiration risk Aspiration precautions    Code Status: DNR   Consultants:  Cardiology  Procedures:  None  Anti-infectives:   None   Discharge Exam: BP (!) 137/48 (BP Location: Left Arm)   Pulse (!) 54   Temp 99.4 F (37.4 C) (Oral)   Resp 18   Ht 5\' 11"  (1.803 m)   Wt 78.9 kg   SpO2 94%   BMI 24.26 kg/m  . General: 87 y.o. year-old male well developed well nourished in no acute distress.  Alert and interactive. . Cardiovascular: Regular rate and rhythm with no rubs or gallops.  No thyromegaly or JVD noted.   Marland Kitchen Respiratory: Clear to auscultation with no wheezes or rales.  Poor inspiratory effort. . Abdomen: Soft nontender nondistended with normal bowel sounds x4 quadrants. . Musculoskeletal: No lower extremity edema. . Skin: No ulcerative lesions noted or rashes . Psychiatry: Mood is appropriate for condition and setting  Discharge Instructions You were cared for by a hospitalist during your hospital stay. If you have any questions about your discharge medications or the care you received while you were in the hospital after you are discharged, you can call the unit and asked to speak with the hospitalist on call if the  hospitalist that took care of you is not available. Once you are discharged, your primary care physician will handle any further medical issues. Please note that NO REFILLS for any discharge medications will be authorized once you are discharged, as it is imperative that you return to your primary care physician (or establish a relationship with a primary care physician if you do not have one) for your aftercare needs so that they can reassess your need for medications and monitor your lab values.   Allergies as of 10/05/2020   No Known Allergies     Medication List    STOP taking these medications   bisoprolol 5 MG tablet Commonly known as: ZEBETA     TAKE these medications   aspirin 81 MG chewable tablet Chew 81 mg by mouth as needed (for pain).   CALCIUM 600 PO Take 600 mg by mouth daily.   fluticasone 50 MCG/ACT nasal spray Commonly known as: FLONASE Place 1 spray into both nostrils daily.   levothyroxine 125 MCG tablet Commonly known as: SYNTHROID Take 125 mcg by mouth daily before breakfast.   Ocuvite Adult 50+ Caps Take 1 capsule by mouth 3 (three) times a week.   OVER THE COUNTER MEDICATION Take 1 capsule by mouth See admin instructions. Unnamed OTC capsule for prostate health- Take 1 or 2 capsules by mouth once a day      No Known Allergies  Follow-up Information  Velna Hatchet, MD. Call in 1 day(s).   Specialty: Internal Medicine Why: Please call for a post hospital follow-up appointment. Contact information: [address] [place] [address] [phone]        Burnell Blanks, MD .   Specialty: Cardiology Contact information: [place]. [address] [place] [address] [phone]                The results of significant diagnostics from this hospitalization (including imaging, microbiology, ancillary and laboratory) are listed below for reference.    Significant Diagnostic Studies: CT Head Wo Contrast  Result  Date: 10/03/2020 CLINICAL DATA:  87 year old post fall.  Abnormal mental status. EXAM: CT HEAD WITHOUT CONTRAST CT CERVICAL SPINE WITHOUT CONTRAST TECHNIQUE: Multidetector CT imaging of the head and cervical spine was performed following the standard protocol without intravenous contrast. Multiplanar CT image reconstructions of the cervical spine were also generated. All images are located in the cervical spine CT in PACS. COMPARISON:  None. FINDINGS: CT HEAD FINDINGS Brain: Age related atrophy. Mild chronic small vessel ischemia. There is an area of low-density involving the right cerebellar hemisphere that may represent subacute infarct. Alternatively a streak artifact from adjacent skull base is considered. No evidence hemorrhage, hydrocephalus, extra-axial collection or mass lesion/mass effect. Vascular: Atherosclerosis of skullbase vasculature without hyperdense vessel or abnormal calcification. Skull: No fracture or focal lesion. Sinuses/Orbits: Scattered mucosal thickening throughout ethmoid air cells as well as both sphenoid sinuses. Mastoid air cells are clear. Bilateral cataract resection. No acute orbital abnormality. Other: None. CT CERVICAL SPINE FINDINGS Alignment: Chest anterolisthesis of C4 on C5 and T1 on T2, likely degenerative. No traumatic subluxation. Skull base and vertebrae: No acute fracture. Vertebral body heights are maintained. The dens and skull base are intact. Prominent multilevel facet hypertrophy. Soft tissues and spinal canal: No prevertebral fluid or swelling. No visible canal hematoma.1 Disc levels: Diffuse degenerative disc disease, prominent involving C5-C6 and C6-C7. prominent multilevel facet hypertrophy. Partial ankylosis of C3-C4 on the left slightly degenerative. Osteophyte versus osseous projection arising from left C5 facet causes narrowing of the spinal canal. There is multilevel neural foraminal narrowing. Upper chest: No acute or unexpected findings scoliosis of the  upper thoracic spine. Other: None. IMPRESSION: 1. No acute intracranial abnormality. No skull fracture. 2. Age related atrophy and chronic small vessel ischemia. 3. Low-density in the right cerebellar hemisphere may represent subacute infarct, or artifact from adjacent skull base. Consider further evaluation with MRI. 4. Multilevel degenerative change throughout the cervical spine without acute fracture or subluxation. 5. Osteophyte versus osseous projection arising from the left C5 facet causes narrowing of the spinal canal. Electronically Signed   By: Keith Rake M.D.   On: 10/03/2020 17:28   CT Cervical Spine Wo Contrast  Result Date: 10/03/2020 CLINICAL DATA:  87 year old post fall.  Abnormal mental status. EXAM: CT HEAD WITHOUT CONTRAST CT CERVICAL SPINE WITHOUT CONTRAST TECHNIQUE: Multidetector CT imaging of the head and cervical spine was performed following the standard protocol without intravenous contrast. Multiplanar CT image reconstructions of the cervical spine were also generated. All images are located in the cervical spine CT in PACS. COMPARISON:  None. FINDINGS: CT HEAD FINDINGS Brain: Age related atrophy. Mild chronic small vessel ischemia. There is an area of low-density involving the right cerebellar hemisphere that may represent subacute infarct. Alternatively a streak artifact from adjacent skull base is considered. No evidence hemorrhage, hydrocephalus, extra-axial collection or mass lesion/mass effect. Vascular: Atherosclerosis of skullbase vasculature without hyperdense vessel or  abnormal calcification. Skull: No fracture or focal lesion. Sinuses/Orbits: Scattered mucosal thickening throughout ethmoid air cells as well as both sphenoid sinuses. Mastoid air cells are clear. Bilateral cataract resection. No acute orbital abnormality. Other: None. CT CERVICAL SPINE FINDINGS Alignment: Chest anterolisthesis of C4 on C5 and T1 on T2, likely degenerative. No traumatic subluxation. Skull  base and vertebrae: No acute fracture. Vertebral body heights are maintained. The dens and skull base are intact. Prominent multilevel facet hypertrophy. Soft tissues and spinal canal: No prevertebral fluid or swelling. No visible canal hematoma.1 Disc levels: Diffuse degenerative disc disease, prominent involving C5-C6 and C6-C7. prominent multilevel facet hypertrophy. Partial ankylosis of C3-C4 on the left slightly degenerative. Osteophyte versus osseous projection arising from left C5 facet causes narrowing of the spinal canal. There is multilevel neural foraminal narrowing. Upper chest: No acute or unexpected findings scoliosis of the upper thoracic spine. Other: None. IMPRESSION: 1. No acute intracranial abnormality. No skull fracture. 2. Age related atrophy and chronic small vessel ischemia. 3. Low-density in the right cerebellar hemisphere may represent subacute infarct, or artifact from adjacent skull base. Consider further evaluation with MRI. 4. Multilevel degenerative change throughout the cervical spine without acute fracture or subluxation. 5. Osteophyte versus osseous projection arising from the left C5 facet causes narrowing of the spinal canal. Electronically Signed   By: Keith Rake M.D.   On: 10/03/2020 17:28   DG Chest Portable 1 View  Result Date: 10/03/2020 CLINICAL DATA:  Shortness of breath.  Syncope at home. EXAM: PORTABLE CHEST 1 VIEW COMPARISON:  05/29/2019 FINDINGS: Lung volumes are low. Upper normal heart size is likely accentuated by portable technique and low lung volumes. Vascular congestion versus bronchovascular crowding. No confluent consolidation. No pneumothorax or large pleural effusion. No acute osseous abnormalities are seen. IMPRESSION: Low lung volumes with vascular congestion versus bronchovascular crowding. Electronically Signed   By: Keith Rake M.D.   On: 10/03/2020 16:35   ECHOCARDIOGRAM COMPLETE  Result Date: 10/05/2020    ECHOCARDIOGRAM REPORT    Patient Name:   SHULEM MADER Date of Exam: 10/05/2020 Medical Rec #:  [id number]       Height:       71.0 in Accession #:    [id number]      Weight:       173.9 lb Date of Birth:  [birth year]       BSA:          1.987 m Patient Age:    87 years        BP:           112/61 mmHg Patient Gender: M               HR:           52 bpm. Exam Location:  Inpatient Procedure: 2D Echo, Cardiac Doppler and Color Doppler Indications:    Elevated troponin  History:        Patient has prior history of Echocardiogram examinations, most                 recent 11/16/2016.  Sonographer:    Merrie Roof, RDCS Referring Phys: [place]  1. Systolic anterior motion of the mitral valve noted: Peak gradient 155 mm Hg (resting). Left ventricular ejection fraction, by estimation, is 50 to 55%. The left ventricle has low normal function. The left ventricle has no regional wall motion abnormalities. There is severe concentric left ventricular hypertrophy. Left ventricular diastolic parameters are indeterminate.  2.  Two jets of mitral regurgitation- one is a late systolic jet associated with LVOT obstruction. The mitral valve is degenerative. Moderate to severe mitral valve regurgitation. The mean mitral valve gradient is 3.0 mmHg with average heart rate of 53 bpm. Severe mitral annular calcification.  3. Right ventricular systolic function is normal. The right ventricular size is normal. Moderately increased right ventricular wall thickness.  4. Left atrial size was severely dilated.  5. The tricuspid valve is degenerative. Tricuspid valve regurgitation is severe.  6. The aortic valve is tricuspid. There is severe calcifcation of the aortic valve. Aortic valve regurgitation is trivial. Mild to moderate aortic valve stenosis.  7. The inferior vena cava is dilated in size with <50% respiratory variability, suggesting right atrial pressure of 15 mmHg. Comparison(s): A prior study was performed on 11/16/2016. Prior images  reviewed side by side. Significant increase in LVOT obstruction, MR is increased from prior, left atrium is further dilated. FINDINGS  Left Ventricle: Systolic anterior motion of the mitral valve noted: Peak gradient 155 mm Hg (resting). Left ventricular ejection fraction, by estimation, is 50 to 55%. The left ventricle has low normal function. The left ventricle has no regional wall motion abnormalities. The left ventricular internal cavity size was normal in size. There is severe concentric left ventricular hypertrophy. Left ventricular diastolic parameters are indeterminate. Right Ventricle: The right ventricular size is normal. Moderately increased right ventricular wall thickness. Right ventricular systolic function is normal. Left Atrium: Left atrial size was severely dilated. Right Atrium: Right atrial size was normal in size. Pericardium: There is no evidence of pericardial effusion. Mitral Valve: Two jets of mitral regurgitation- one is a late systolic jet associated with LVOT obstruction. The mitral valve is degenerative in appearance. Severe mitral annular calcification. Moderate to severe mitral valve regurgitation. The mean mitral valve gradient is 3.0 mmHg with average heart rate of 53 bpm. Tricuspid Valve: The tricuspid valve is degenerative in appearance. Tricuspid valve regurgitation is severe. No evidence of tricuspid stenosis. Aortic Valve: The aortic valve is tricuspid. There is severe calcifcation of the aortic valve. Aortic valve regurgitation is trivial. Mild to moderate aortic stenosis is present. Aortic valve mean gradient measures 21.0 mmHg. Aortic valve peak gradient measures 38.4 mmHg. Pulmonic Valve: The pulmonic valve was not well visualized. Pulmonic valve regurgitation is mild. No evidence of pulmonic stenosis. Aorta: The aortic root and ascending aorta are structurally normal, with no evidence of dilitation. Venous: The inferior vena cava is dilated in size with less than 50%  respiratory variability, suggesting right atrial pressure of 15 mmHg. IAS/Shunts: The atrial septum is grossly normal.  LEFT VENTRICLE PLAX 2D LVIDd:         4.40 cm  Diastology LVIDs:         2.90 cm  LV e' medial:    5.59 cm/s LV PW:         1.10 cm  LV E/e' medial:  27.0 LV IVS:        1.20 cm  LV e' lateral:   8.59 cm/s LVOT diam:     2.00 cm  LV E/e' lateral: 17.6 LVOT Area:     3.14 cm  RIGHT VENTRICLE          IVC RV Basal diam:  3.85 cm  IVC diam: 2.50 cm LEFT ATRIUM            Index       RIGHT ATRIUM           Index  LA diam:      4.10 cm  2.06 cm/m  RA Area:     21.30 cm LA Vol (A2C): 188.0 ml 94.61 ml/m RA Volume:   65.20 ml  32.81 ml/m LA Vol (A4C): 157.0 ml 79.01 ml/m  AORTIC VALVE AV Vmax:      310.00 cm/s AV Vmean:     220.000 cm/s AV VTI:       0.752 m AV Peak Grad: 38.4 mmHg AV Mean Grad: 21.0 mmHg  AORTA Ao Root diam: 3.60 cm MITRAL VALVE MV Area (PHT): 4.39 cm     SHUNTS MV Mean grad:  3.0 mmHg     Systemic Diam: 2.00 cm MV Decel Time: 173 msec MV E velocity: 151.00 cm/s MV A velocity: 118.00 cm/s MV E/A ratio:  1.28 Rudean Haskell MD Electronically signed by Rudean Haskell MD Signature Date/Time: 10/05/2020/2:31:23 PM    Final     Microbiology: Recent Results (from the past 240 hour(s))  Resp Panel by RT-PCR (Flu A&B, Covid) Nasopharyngeal Swab     Status: None   Collection Time: 10/03/20  4:33 PM   Specimen: Nasopharyngeal Swab; Nasopharyngeal(NP) swabs in vial transport medium  Result Value Ref Range Status   SARS Coronavirus 2 by RT PCR NEGATIVE NEGATIVE Final    Comment: (NOTE) SARS-CoV-2 target nucleic acids are NOT DETECTED.  The SARS-CoV-2 RNA is generally detectable in upper respiratory specimens during the acute phase of infection. The lowest concentration of SARS-CoV-2 viral copies this assay can detect is 138 copies/mL. A negative result does not preclude SARS-Cov-2 infection and should not be used as the sole basis for treatment or other patient  management decisions. A negative result may occur with  improper specimen collection/handling, submission of specimen other than nasopharyngeal swab, presence of viral mutation(s) within the areas targeted by this assay, and inadequate number of viral copies(<138 copies/mL). A negative result must be combined with clinical observations, patient history, and epidemiological information. The expected result is Negative.  Fact Sheet for Patients:  EntrepreneurPulse.com.au  Fact Sheet for Healthcare Providers:  IncredibleEmployment.be  This test is no t yet approved or cleared by the [place] FDA and  has been authorized for detection and/or diagnosis of SARS-CoV-2 by FDA under an Emergency Use Authorization (EUA). This EUA will remain  in effect (meaning this test can be used) for the duration of the COVID-19 declaration under Section 564(b)(1) of the Act, 21 U.S.C.section 360bbb-3(b)(1), unless the authorization is terminated  or revoked sooner.       Influenza A by PCR NEGATIVE NEGATIVE Final   Influenza B by PCR NEGATIVE NEGATIVE Final    Comment: (NOTE) The Xpert Xpress SARS-CoV-2/FLU/RSV plus assay is intended as an aid in the diagnosis of influenza from Nasopharyngeal swab specimens and should not be used as a sole basis for treatment. Nasal washings and aspirates are unacceptable for Xpert Xpress SARS-CoV-2/FLU/RSV testing.  Fact Sheet for Patients: EntrepreneurPulse.com.au  Fact Sheet for Healthcare Providers: IncredibleEmployment.be  This test is not yet approved or cleared by the [place] FDA and has been authorized for detection and/or diagnosis of SARS-CoV-2 by FDA under an Emergency Use Authorization (EUA). This EUA will remain in effect (meaning this test can be used) for the duration of the COVID-19 declaration under Section 564(b)(1) of the Act, 21 U.S.C. section 360bbb-3(b)(1),  unless the authorization is terminated or revoked.  Performed at [facility], [place] [address] [place], [place] [address]      Labs: Basic Metabolic  Panel: Recent Labs  Lab 10/03/20 1612 10/03/20 2105 10/04/20 0518 10/05/20 0609  NA 136  --  138 136  K 4.2  --  4.0 3.6  CL 106  --  110 108  CO2 24  --  20* 22  GLUCOSE 124*  --  109* 100*  BUN 26*  --  29* 22  CREATININE 1.51*  --  1.56* 1.34*  CALCIUM 8.5*  --  7.9* 7.9*  MG  --  2.0 1.9 1.7  PHOS  --  3.9 3.5 2.5   Liver Function Tests: Recent Labs  Lab 10/03/20 1612 10/04/20 0518  AST 26 27  ALT 22 21  ALKPHOS 49 42  BILITOT 1.0 0.5  PROT 5.8* 5.1*  ALBUMIN 3.6 3.2*   No results for input(s): LIPASE, AMYLASE in the last 168 hours. No results for input(s): AMMONIA in the last 168 hours. CBC: Recent Labs  Lab 10/03/20 1612 10/04/20 0518 10/05/20 0609  WBC 7.7 8.3 9.4  NEUTROABS 6.9 6.5  --   HGB 12.3* 10.6* 10.7*  HCT 37.3* 31.8* 32.3*  MCV 98.4 100.3* 97.9  PLT 137* 132* 118*   Cardiac Enzymes: Recent Labs  Lab 10/03/20 1612  CKTOTAL 134   BNP: BNP (last 3 results) No results for input(s): BNP in the last 8760 hours.  ProBNP (last 3 results) No results for input(s): PROBNP in the last 8760 hours.  CBG: Recent Labs  Lab 10/03/20 2110  GLUCAP 112*       Signed:  Kayleen Memos, MD [facility] 10/05/2020, 5:33 PM

## 2020-10-05 NOTE — Progress Notes (Addendum)
Progress Note  Patient Name: Stelios Kirby Date of Encounter: 10/05/2020  [facility] Cardiologist: Lauree Chandler, MD   Subjective   No further syncopal episodes, here with his family in room.  Feeling better.  Sitting in chair.  Has incontinence at home and purposely does not drink that much.  He does drink 3 small cups of coffee a day.  Inpatient Medications    Scheduled Meds: . levothyroxine  125 mcg Oral QAC breakfast   Continuous Infusions:  PRN Meds: acetaminophen **OR** acetaminophen   Vital Signs    Vitals:   10/05/20 0300 10/05/20 0406 10/05/20 0802 10/05/20 1140  BP:  (!) 123/55 (!) 115/55 112/61  Pulse:  62 (!) 58 (!) 52  Resp:  20 18 18   Temp:  98.2 F (36.8 C) 98.4 F (36.9 C) 97.8 F (36.6 C)  TempSrc:  Oral Oral Oral  SpO2:  91% 97% 95%  Weight: 78.9 kg     Height: 5\' 11"  (1.803 m)       Intake/Output Summary (Last 24 hours) at 10/05/2020 1218 Last data filed at 10/05/2020 0848 Gross per 24 hour  Intake 1605.51 ml  Output 825 ml  Net 780.51 ml   Last 3 Weights 10/05/2020 01/03/2020 05/29/2019  Weight (lbs) 173 lb 15.1 oz 165 lb 164 lb 3.9 oz  Weight (kg) 78.9 kg 74.844 kg 74.5 kg      Telemetry    No pauses, no adverse arrhythmias.  Interventricular conduction delay with sinus rhythm noted- Personally Reviewed  ECG    Sinus bradycardia PR interval 300 ms, first-degree AV block, left bundle branch block- Personally Reviewed  Physical Exam   GEN: No acute distress.  Elderly Neck: No JVD Cardiac: RRR, 3/6 SM, rubs, or gallops.  Respiratory: Clear to auscultation bilaterally. GI: Soft, nontender, non-distended  MS: No edema; No deformity. Neuro:  Nonfocal  Psych: Normal affect   Labs    High Sensitivity Troponin:   Recent Labs  Lab 10/03/20 1812 10/03/20 2300 10/04/20 0518 10/04/20 1003 10/04/20 1652  TROPONINIHS 283* 273* 237* 211* 190*      Chemistry Recent Labs  Lab 10/03/20 1612 10/04/20 0518 10/05/20 0609   NA 136 138 136  K 4.2 4.0 3.6  CL 106 110 108  CO2 24 20* 22  GLUCOSE 124* 109* 100*  BUN 26* 29* 22  CREATININE 1.51* 1.56* 1.34*  CALCIUM 8.5* 7.9* 7.9*  PROT 5.8* 5.1*  --   ALBUMIN 3.6 3.2*  --   AST 26 27  --   ALT 22 21  --   ALKPHOS 49 42  --   BILITOT 1.0 0.5  --   GFRNONAA 42* 41* 49*  ANIONGAP 6 8 6      Hematology Recent Labs  Lab 10/03/20 1612 10/04/20 0518 10/05/20 0609  WBC 7.7 8.3 9.4  RBC 3.79* 3.17* 3.30*  HGB 12.3* 10.6* 10.7*  HCT 37.3* 31.8* 32.3*  MCV 98.4 100.3* 97.9  MCH 32.5 33.4 32.4  MCHC 33.0 33.3 33.1  RDW 13.5 13.6 13.8  PLT 137* 132* 118*    BNPNo results for input(s): BNP, PROBNP in the last 168 hours.   DDimer No results for input(s): DDIMER in the last 168 hours.   Radiology    CT Head Wo Contrast  Result Date: 10/03/2020 CLINICAL DATA:  87 year old post fall.  Abnormal mental status. EXAM: CT HEAD WITHOUT CONTRAST CT CERVICAL SPINE WITHOUT CONTRAST TECHNIQUE: Multidetector CT imaging of the head and cervical spine was performed following the  standard protocol without intravenous contrast. Multiplanar CT image reconstructions of the cervical spine were also generated. All images are located in the cervical spine CT in PACS. COMPARISON:  None. FINDINGS: CT HEAD FINDINGS Brain: Age related atrophy. Mild chronic small vessel ischemia. There is an area of low-density involving the right cerebellar hemisphere that may represent subacute infarct. Alternatively a streak artifact from adjacent skull base is considered. No evidence hemorrhage, hydrocephalus, extra-axial collection or mass lesion/mass effect. Vascular: Atherosclerosis of skullbase vasculature without hyperdense vessel or abnormal calcification. Skull: No fracture or focal lesion. Sinuses/Orbits: Scattered mucosal thickening throughout ethmoid air cells as well as both sphenoid sinuses. Mastoid air cells are clear. Bilateral cataract resection. No acute orbital abnormality. Other:  None. CT CERVICAL SPINE FINDINGS Alignment: Chest anterolisthesis of C4 on C5 and T1 on T2, likely degenerative. No traumatic subluxation. Skull base and vertebrae: No acute fracture. Vertebral body heights are maintained. The dens and skull base are intact. Prominent multilevel facet hypertrophy. Soft tissues and spinal canal: No prevertebral fluid or swelling. No visible canal hematoma.1 Disc levels: Diffuse degenerative disc disease, prominent involving C5-C6 and C6-C7. prominent multilevel facet hypertrophy. Partial ankylosis of C3-C4 on the left slightly degenerative. Osteophyte versus osseous projection arising from left C5 facet causes narrowing of the spinal canal. There is multilevel neural foraminal narrowing. Upper chest: No acute or unexpected findings scoliosis of the upper thoracic spine. Other: None. IMPRESSION: 1. No acute intracranial abnormality. No skull fracture. 2. Age related atrophy and chronic small vessel ischemia. 3. Low-density in the right cerebellar hemisphere may represent subacute infarct, or artifact from adjacent skull base. Consider further evaluation with MRI. 4. Multilevel degenerative change throughout the cervical spine without acute fracture or subluxation. 5. Osteophyte versus osseous projection arising from the left C5 facet causes narrowing of the spinal canal. Electronically Signed   By: Keith Rake M.D.   On: 10/03/2020 17:28   CT Cervical Spine Wo Contrast  Result Date: 10/03/2020 CLINICAL DATA:  87 year old post fall.  Abnormal mental status. EXAM: CT HEAD WITHOUT CONTRAST CT CERVICAL SPINE WITHOUT CONTRAST TECHNIQUE: Multidetector CT imaging of the head and cervical spine was performed following the standard protocol without intravenous contrast. Multiplanar CT image reconstructions of the cervical spine were also generated. All images are located in the cervical spine CT in PACS. COMPARISON:  None. FINDINGS: CT HEAD FINDINGS Brain: Age related atrophy. Mild  chronic small vessel ischemia. There is an area of low-density involving the right cerebellar hemisphere that may represent subacute infarct. Alternatively a streak artifact from adjacent skull base is considered. No evidence hemorrhage, hydrocephalus, extra-axial collection or mass lesion/mass effect. Vascular: Atherosclerosis of skullbase vasculature without hyperdense vessel or abnormal calcification. Skull: No fracture or focal lesion. Sinuses/Orbits: Scattered mucosal thickening throughout ethmoid air cells as well as both sphenoid sinuses. Mastoid air cells are clear. Bilateral cataract resection. No acute orbital abnormality. Other: None. CT CERVICAL SPINE FINDINGS Alignment: Chest anterolisthesis of C4 on C5 and T1 on T2, likely degenerative. No traumatic subluxation. Skull base and vertebrae: No acute fracture. Vertebral body heights are maintained. The dens and skull base are intact. Prominent multilevel facet hypertrophy. Soft tissues and spinal canal: No prevertebral fluid or swelling. No visible canal hematoma.1 Disc levels: Diffuse degenerative disc disease, prominent involving C5-C6 and C6-C7. prominent multilevel facet hypertrophy. Partial ankylosis of C3-C4 on the left slightly degenerative. Osteophyte versus osseous projection arising from left C5 facet causes narrowing of the spinal canal. There is multilevel neural foraminal narrowing. Upper chest:  No acute or unexpected findings scoliosis of the upper thoracic spine. Other: None. IMPRESSION: 1. No acute intracranial abnormality. No skull fracture. 2. Age related atrophy and chronic small vessel ischemia. 3. Low-density in the right cerebellar hemisphere may represent subacute infarct, or artifact from adjacent skull base. Consider further evaluation with MRI. 4. Multilevel degenerative change throughout the cervical spine without acute fracture or subluxation. 5. Osteophyte versus osseous projection arising from the left C5 facet causes  narrowing of the spinal canal. Electronically Signed   By: Keith Rake M.D.   On: 10/03/2020 17:28   DG Chest Portable 1 View  Result Date: 10/03/2020 CLINICAL DATA:  Shortness of breath.  Syncope at home. EXAM: PORTABLE CHEST 1 VIEW COMPARISON:  05/29/2019 FINDINGS: Lung volumes are low. Upper normal heart size is likely accentuated by portable technique and low lung volumes. Vascular congestion versus bronchovascular crowding. No confluent consolidation. No pneumothorax or large pleural effusion. No acute osseous abnormalities are seen. IMPRESSION: Low lung volumes with vascular congestion versus bronchovascular crowding. Electronically Signed   By: Keith Rake M.D.   On: 10/03/2020 16:35     Patient Profile     87 y.o. male with orthostatic hypotension, syncope likely related to intravascular volume depletion.  Assessment & Plan    -Agree with hydration -Avoid antihypertensives -He does have underlying conduction disease, interventricular conduction delay however no adverse pauses or arrhythmias noted on telemetry. -Creatinine improving with hydration. -Outflow murmur, longstanding,  heard -Continue to recommend support stockings -If after aggressive hydration and salt liberalization these episodes still continue, 1 could consider addition of midodrine.  Okay from cardiology perspective for discharge when felt comfortable by primary team.  No new recommendations. We will sign off.  Please let us know if we can be of further assistance.      For questions or updates, please contact [place] Please consult [email or web address] for contact info under        Signed, Candee Furbish, MD  10/05/2020, 12:18 PM

## 2020-10-05 NOTE — Progress Notes (Signed)
Physical Therapy Treatment Patient Details Name: Edward Foster MRN: [id number] DOB: 1923/05/18 Today's Date: 10/05/2020    History of Present Illness Pt is a 87 y/o male admitted secondary to syncopal episode. Pt also with nausea/vomiting/diarrhea prior to admission. CT head showed possible subacute stroke in R cerebellar hemisphere. Family decline MRI. PMH includes R THA and prostate cancer.    PT Comments    Patient impulsive this session with OOB mobility with no AD. Patient with LOB x 3 requiring minA to recover and patient reaching for objects. Patient ambulated 76' with RW and min guard with improved gait quality and controlled gait speed. Educated patient on fall risk and factors increasing fall risk, patient and family verbalize understanding. Continue to recommend HHPT following discharge and supervision for OOB mobility.      Follow Up Recommendations  Home health PT;Supervision for mobility/OOB     Equipment Recommendations  None recommended by PT    Recommendations for Other Services       Precautions / Restrictions Precautions Precautions: Fall Restrictions Weight Bearing Restrictions: No    Mobility  Bed Mobility               General bed mobility comments: In recliner on arrival    Transfers Overall transfer level: Needs assistance Equipment used: None;Rolling Cathi Hazan (2 wheeled) Transfers: Sit to/from Stand Sit to Stand: Min guard         General transfer comment: min guard for safety  Ambulation/Gait Ambulation/Gait assistance: Min assist;Min guard Gait Distance (Feet): 20 Feet (x50') Assistive device: None;Rolling Ludwika Rodd (2 wheeled) Gait Pattern/deviations: Step-through pattern;Wide base of support;Drifts right/left;Staggering left;Staggering right Gait velocity: fluctuating   General Gait Details: Patient impulsive with mobility without AD, LOB x 3 with 20'. Direct verbal and tactiles to stop mobility as patient was becoming unsafe with  increasing gait speed and reaching for objects. Ambulated with RW for 50' with slow controlled gait speed and less impulsivity. Improved balance with RW   Stairs             Wheelchair Mobility    Modified Rankin (Stroke Patients Only) Modified Rankin (Stroke Patients Only) Pre-Morbid Rankin Score: No symptoms Modified Rankin: Moderately severe disability     Balance Overall balance assessment: Needs assistance Sitting-balance support: No upper extremity supported;Feet supported Sitting balance-Leahy Scale: Good     Standing balance support: No upper extremity supported;During functional activity Standing balance-Leahy Scale: Poor Standing balance comment: minA to maintain balance with ambulation with no AD due to impulsivity                            Cognition Arousal/Alertness: Awake/alert Behavior During Therapy: Impulsive Overall Cognitive Status: Impaired/Different from baseline Area of Impairment: Problem solving                             Problem Solving: Slow processing General Comments: Impulsive throughout with mobility. Required verbal and tactile cues to stop activity as patient was unsafe and putting self at risk for falls without AD      Exercises      General Comments General comments (skin integrity, edema, etc.): Son and daughter present during session      Pertinent Vitals/Pain Pain Assessment: No/denies pain    Home Living                      Prior Function  PT Goals (current goals can now be found in the care plan section) Acute Rehab PT Goals Patient Stated Goal: to go home PT Goal Formulation: With patient/family Time For Goal Achievement: 10/18/20 Potential to Achieve Goals: Good Progress towards PT goals: Progressing toward goals    Frequency    Min 3X/week      PT Plan Current plan remains appropriate    Co-evaluation              AM-PAC PT "6 Clicks" Mobility    Outcome Measure  Help needed turning from your back to your side while in a flat bed without using bedrails?: None Help needed moving from lying on your back to sitting on the side of a flat bed without using bedrails?: None Help needed moving to and from a bed to a chair (including a wheelchair)?: A Little Help needed standing up from a chair using your arms (e.g., wheelchair or bedside chair)?: A Little Help needed to walk in hospital room?: A Little Help needed climbing 3-5 steps with a railing? : A Lot 6 Click Score: 19    End of Session Equipment Utilized During Treatment: Gait belt Activity Tolerance: Patient tolerated treatment well Patient left: in chair;with call bell/phone within reach;with family/visitor present Nurse Communication: Mobility status PT Visit Diagnosis: Unsteadiness on feet (R26.81);Muscle weakness (generalized) (M62.81)     Time: 4142-3953 PT Time Calculation (min) (ACUTE ONLY): 20 min  Charges:  $Gait Training: 8-22 mins                     Carsyn Taubman A. Gilford Rile PT, DPT Acute Rehabilitation Services Pager [phone] Office [phone]    Linna Hoff 10/05/2020, 3:51 PM

## 2020-10-05 NOTE — Evaluation (Signed)
Clinical/Bedside Swallow Evaluation Patient Details  Name: Edward Foster MRN: [id number] Date of Birth: [birth year]  Today's Date: 10/05/2020 Time: SLP Start Time (ACUTE ONLY): 73 SLP Stop Time (ACUTE ONLY): 1149 SLP Time Calculation (min) (ACUTE ONLY): 19 min  Past Medical History:  Past Medical History:  Diagnosis Date  . Heart murmur   . HOH (hard of hearing)   . Hypertrophic cardiomyopathy ([place])    Echo 5/18: severe conc LVH, EF 50-55, LVOT gradient not well characterized but peak velocity 2.5 m/sec; mild to mod AS (mean 19, peak 33), mild AI, severe post MAC, mod LAE, PFO cannot be excluded  . Hypothyroidism   . Prostate cancer [facility])    Past Surgical History:  Past Surgical History:  Procedure Laterality Date  . SEED IMPLANTS - PROSTATE    . TOTAL HIP ARTHROPLASTY Right 05/29/2019   Procedure: RIGHT TOTAL HIP ARTHROPLASTY ANTERIOR APPROACH;  Surgeon: Frederik Pear, MD;  Location: [facility] ORS;  Service: Orthopedics;  Laterality: Right;   HPI:  87 y.o. male with medical history significant of hypertrophic cardiomyopathy, hypothyroidism prostate cancer s/p brachytherapy and XRT, LBBB admitted on 10/03/20 with decreased PO intake and syncopal event. 10/03/20 CXR indicated Low lung volumes with vascular congestion versus bronchovascular crowding.  Assessment / Plan / Recommendation Clinical Impression  Pt seen for clnical swallowing evaluation d/t decreased PO intake with family/pt reporting no issues with swallowing prior to hospitalization with exception of decreased appetite.  Swallow appeared brisk with good oral control and pharyngeal clearance; no overt s/s of aspiration noted throughout evaluation.  Recommend Dysphagia 3/thin liquid diet continue per family request for ease of intake.  Thank you for this consult.  ST will s/o in acute setting.   SLP Visit Diagnosis: Dysphagia, unspecified (R13.10)    Aspiration Risk  Mild aspiration risk    Diet Recommendation   Dysphagia 3/thin  liquids  Medication Administration: Whole meds with liquid    Other  Recommendations Oral Care Recommendations: Oral care BID   Follow up Recommendations None      Frequency and Duration   Evaluation only         Prognosis Prognosis for Safe Diet Advancement: Good      Swallow Study   General Date of Onset: 10/03/20 HPI: 87 y.o. male with medical history significant of hypertrophic cardiomyopathy, hypothyroidism prostate cancer s/p brachytherapy and XRT, LBBB admitted on 10/03/20 with decreased PO intake and syncopal event. Type of Study: Bedside Swallow Evaluation Previous Swallow Assessment: n/a Diet Prior to this Study: Dysphagia 3 (soft);Thin liquids Temperature Spikes Noted: No Respiratory Status: Room air History of Recent Intubation: No Behavior/Cognition: Alert;Cooperative;Other (Comment);Requires cueing [facility]) Oral Cavity Assessment: Within Functional Limits Oral Care Completed by SLP: Recent completion by staff Oral Cavity - Dentition: Adequate natural dentition Vision: Functional for self-feeding Self-Feeding Abilities: Able to feed self;Needs assist Patient Positioning: Upright in chair Baseline Vocal Quality: Normal Volitional Cough: Strong Volitional Swallow: Able to elicit    Oral/Motor/Sensory Function Overall Oral Motor/Sensory Function: Within functional limits   Ice Chips Ice chips: Not tested   Thin Liquid Thin Liquid: Within functional limits Presentation: Cup;Straw    Nectar Thick Nectar Thick Liquid: Not tested   Honey Thick Honey Thick Liquid: Not tested   Puree Puree: Within functional limits Presentation: Spoon   Solid     Solid: Within functional limits Presentation: Self Fed      Elvina Sidle, M.S., CCC-SLP 10/05/2020,12:43 PM

## 2020-10-06 ENCOUNTER — Telehealth: Payer: Self-pay

## 2020-10-06 NOTE — Telephone Encounter (Signed)
Patient DC'd after 5pm previous day. Called daughter Stanton Kidney to set up home health services. Discussed Medicare ratings and home health services. Referral accepted by [facility] liaison [place]. Stanton Kidney understands she will be getting a call in the next 1-2 days to arrange the first home appointment.  No other CM needs.

## 2020-10-07 DIAGNOSIS — I350 Nonrheumatic aortic (valve) stenosis: Secondary | ICD-10-CM | POA: Diagnosis not present

## 2020-10-07 DIAGNOSIS — Z79899 Other long term (current) drug therapy: Secondary | ICD-10-CM | POA: Diagnosis not present

## 2020-10-07 DIAGNOSIS — I421 Obstructive hypertrophic cardiomyopathy: Secondary | ICD-10-CM | POA: Diagnosis not present

## 2020-10-07 DIAGNOSIS — Z87891 Personal history of nicotine dependence: Secondary | ICD-10-CM | POA: Diagnosis not present

## 2020-10-07 DIAGNOSIS — N179 Acute kidney failure, unspecified: Secondary | ICD-10-CM | POA: Diagnosis not present

## 2020-10-07 DIAGNOSIS — E860 Dehydration: Secondary | ICD-10-CM | POA: Diagnosis not present

## 2020-10-07 DIAGNOSIS — I130 Hypertensive heart and chronic kidney disease with heart failure and stage 1 through stage 4 chronic kidney disease, or unspecified chronic kidney disease: Secondary | ICD-10-CM | POA: Diagnosis not present

## 2020-10-07 DIAGNOSIS — I5022 Chronic systolic (congestive) heart failure: Secondary | ICD-10-CM | POA: Diagnosis not present

## 2020-10-07 DIAGNOSIS — Z7982 Long term (current) use of aspirin: Secondary | ICD-10-CM | POA: Diagnosis not present

## 2020-10-07 DIAGNOSIS — Z9181 History of falling: Secondary | ICD-10-CM | POA: Diagnosis not present

## 2020-10-07 DIAGNOSIS — N1832 Chronic kidney disease, stage 3b: Secondary | ICD-10-CM | POA: Diagnosis not present

## 2020-10-07 DIAGNOSIS — R1310 Dysphagia, unspecified: Secondary | ICD-10-CM | POA: Diagnosis not present

## 2020-10-07 DIAGNOSIS — E039 Hypothyroidism, unspecified: Secondary | ICD-10-CM | POA: Diagnosis not present

## 2020-10-09 DIAGNOSIS — I959 Hypotension, unspecified: Secondary | ICD-10-CM | POA: Diagnosis not present

## 2020-10-09 DIAGNOSIS — N1831 Chronic kidney disease, stage 3a: Secondary | ICD-10-CM | POA: Diagnosis not present

## 2020-10-09 DIAGNOSIS — I421 Obstructive hypertrophic cardiomyopathy: Secondary | ICD-10-CM | POA: Diagnosis not present

## 2020-10-09 DIAGNOSIS — R55 Syncope and collapse: Secondary | ICD-10-CM | POA: Diagnosis not present

## 2020-10-09 DIAGNOSIS — G9341 Metabolic encephalopathy: Secondary | ICD-10-CM | POA: Diagnosis not present

## 2020-10-09 DIAGNOSIS — R2681 Unsteadiness on feet: Secondary | ICD-10-CM | POA: Diagnosis not present

## 2020-10-09 DIAGNOSIS — I69991 Dysphagia following unspecified cerebrovascular disease: Secondary | ICD-10-CM | POA: Diagnosis not present

## 2020-10-30 ENCOUNTER — Ambulatory Visit: Payer: PPO | Admitting: Physician Assistant

## 2020-10-30 ENCOUNTER — Encounter: Payer: Self-pay | Admitting: Physician Assistant

## 2020-10-30 ENCOUNTER — Other Ambulatory Visit: Payer: Self-pay

## 2020-10-30 VITALS — BP 104/70 | HR 70 | Ht 70.0 in | Wt 164.6 lb

## 2020-10-30 DIAGNOSIS — R001 Bradycardia, unspecified: Secondary | ICD-10-CM

## 2020-10-30 DIAGNOSIS — R55 Syncope and collapse: Secondary | ICD-10-CM | POA: Diagnosis not present

## 2020-10-30 DIAGNOSIS — I421 Obstructive hypertrophic cardiomyopathy: Secondary | ICD-10-CM | POA: Diagnosis not present

## 2020-10-30 NOTE — Patient Instructions (Signed)
Medication Instructions:  Your physician recommends that you continue on your current medications as directed. Please refer to the Current Medication list given to you today.  *If you need a refill on your cardiac medications before your next appointment, please call your pharmacy*   Lab Work: None ordered  If you have labs (blood work) drawn today and your tests are completely normal, you will receive your results only by: . MyChart Message (if you have MyChart) OR . A paper copy in the mail If you have any lab test that is abnormal or we need to change your treatment, we will call you to review the results.   Testing/Procedures: None ordered   Follow-Up: At [facility], you and your health needs are our priority.  As part of our continuing mission to provide you with exceptional heart care, we have created designated Provider Care Teams.  These Care Teams include your primary Cardiologist (physician) and Advanced Practice Providers (APPs -  Physician Assistants and Nurse Practitioners) who all work together to provide you with the care you need, when you need it.  We recommend signing up for the patient portal called "MyChart".  Sign up information is provided on this After Visit Summary.  MyChart is used to connect with patients for Virtual Visits (Telemedicine).  Patients are able to view lab/test results, encounter notes, upcoming appointments, etc.  Non-urgent messages can be sent to your provider as well.   To learn more about what you can do with MyChart, go to [email or web address].    Your next appointment:   12 month(s)  The format for your next appointment:   In Person  Provider:   You may see Christopher McAlhany, MD or one of the following Advanced Practice Providers on your designated Care Team:    Dayna Dunn, PA-C  Michele Lenze, PA-C    Other Instructions   

## 2020-10-30 NOTE — Progress Notes (Signed)
Cardiology Office Note:    Date:  10/30/2020   ID:  Edward Foster, DOB March 13, 1923, MRN [id number]  PCP:  Velna Hatchet, MD  [facility] Cardiologist:  Lauree Chandler, MD  [place] Electrophysiologist:  None   Chief Complaint: Hospital follow up  History of Present Illness:    Edward Foster is a 87 y.o. male with a hx of hypertrophic cardiomyopathy, prostate cancer s/p brachytherapy and XRT, chronic LBBB, aortic stenosis and  hypothyroidism seen for hospital follow up.   He has been followed by Dr. Cory Roughen ([facility] [place]) for idiopathic sub-aortic stenosis. No prior CAD or MI. He moved from [place] in June 2013 to be near his daughter in [place] and son in [place]. He was seen in our office 11/04/16 after a near syncopal event after starting a herbal supplement for his bladder. This medication was stopped due to anticholinergic side effects. Echo May 2018 with normal LV function, severe concentric LVH, mild to moderate AS, SAM LVOT gradient not well characterized but peak velocity in the 2.5 m/sec range, overall unchanged. 24 hour cardiac monitor with sinus rhythm, PVCs, no long pauses. He had a syncopal episode in February 2020. He was seen in our office August 2020 by Richardson Dopp, PA-C and his beta blocker dose was decreased. Cardiac monitor with several quick bursts of SVT but no pauses or heart block noted.   Admitted 10/1998 for syncope and felt likely due to intravascular volume depletion. DC bisoprolol. CT head showed possible subacute stroke. No plan for MRI after family discussion.   Here today for follow up with daughter.  No recurrent syncope.  He lives by himself but now has more care with family member.  Systolic blood pressure runs in 110-140 range.  He wears compression stockings and try to drink more water.   Past Medical History:  Diagnosis Date  . Heart murmur   . HOH (hard of hearing)   . Hypertrophic cardiomyopathy ([place])    Echo 5/18: severe  conc LVH, EF 50-55, LVOT gradient not well characterized but peak velocity 2.5 m/sec; mild to mod AS (mean 19, peak 33), mild AI, severe post MAC, mod LAE, PFO cannot be excluded  . Hypothyroidism   . Prostate cancer [facility])     Past Surgical History:  Procedure Laterality Date  . SEED IMPLANTS - PROSTATE    . TOTAL HIP ARTHROPLASTY Right 05/29/2019   Procedure: RIGHT TOTAL HIP ARTHROPLASTY ANTERIOR APPROACH;  Surgeon: Frederik Pear, MD;  Location: [facility]S;  Service: Orthopedics;  Laterality: Right;    Current Medications: Current Meds  Medication Sig  . Calcium Carbonate (CALCIUM 600 PO) Take 600 mg by mouth daily.   . fluticasone (FLONASE) 50 MCG/ACT nasal spray Place 1 spray into both nostrils daily.  Marland Kitchen levothyroxine (SYNTHROID, LEVOTHROID) 125 MCG tablet Take 125 mcg by mouth daily before breakfast.  . OVER THE COUNTER MEDICATION Take 1 capsule by mouth See admin instructions. Unnamed OTC capsule for prostate health- Take 1 or 2 capsules by mouth once a day     Allergies:   Patient has no known allergies.   Social History   Socioeconomic History  . Marital status: Widowed    Spouse name: Not on file  . Number of children: 4  . Years of education: Not on file  . Highest education level: Not on file  Occupational History  . Occupation: Retired  Tobacco Use  . Smoking status: Former Smoker    Years: 20.00  Quit date: 07/19/1973    Years since quitting: 47.3  . Smokeless tobacco: Former Network engineer  . Vaping Use: Never used  Substance and Sexual Activity  . Alcohol use: Yes    Alcohol/week: 8.0 standard drinks    Types: 7 Standard drinks or equivalent, 1 Shots of liquor per week  . Drug use: No  . Sexual activity: Not on file  Other Topics Concern  . Not on file  Social History Narrative  . Not on file   Social Determinants of Health   Financial Resource Strain: Not on file  Food Insecurity: Not on file  Transportation Needs: Not on file  Physical Activity:  Not on file  Stress: Not on file  Social Connections: Not on file     Family History: The patient's family history includes Diabetes in his mother.    ROS:   Please see the history of present illness.    All other systems reviewed and are negative.   EKGs/Labs/Other Studies Reviewed:    The following studies were reviewed today:  Echo 10/2020 1. Systolic anterior motion of the mitral valve noted: Peak gradient 155  mm Hg (resting). Left ventricular ejection fraction, by estimation, is 50  to 55%. The left ventricle has low normal function. The left ventricle has  no regional wall motion  abnormalities. There is severe concentric left ventricular hypertrophy.  Left ventricular diastolic parameters are indeterminate.  2. Two jets of mitral regurgitation- one is a late systolic jet  associated with LVOT obstruction. The mitral valve is degenerative.  Moderate to severe mitral valve regurgitation. The mean mitral valve  gradient is 3.0 mmHg with average heart rate of 53  bpm. Severe mitral annular calcification.  3. Right ventricular systolic function is normal. The right ventricular  size is normal. Moderately increased right ventricular wall thickness.  4. Left atrial size was severely dilated.  5. The tricuspid valve is degenerative. Tricuspid valve regurgitation is  severe.  6. The aortic valve is tricuspid. There is severe calcifcation of the  aortic valve. Aortic valve regurgitation is trivial. Mild to moderate  aortic valve stenosis.  7. The inferior vena cava is dilated in size with <50% respiratory  variability, suggesting right atrial pressure of 15 mmHg.   EKG:  EKG is not ordered today.   Recent Labs: 10/04/2020: ALT 21; TSH 0.351 10/05/2020: BUN 22; Creatinine, Ser 1.34; Hemoglobin 10.7; Magnesium 1.7; Platelets 118; Potassium 3.6; Sodium 136  Recent Lipid Panel    Component Value Date/Time   CHOL 112 10/04/2020 0518   TRIG 118 10/04/2020 0518   HDL 26 (L)  10/04/2020 0518   CHOLHDL 4.3 10/04/2020 0518   VLDL 24 10/04/2020 0518   LDLCALC 62 10/04/2020 0518    Physical Exam:    VS:  BP 104/70   Pulse 70   Ht _0  (1.778 m)   Wt 164 lb 9.6 oz (74.7 kg)   SpO2 96%   BMI 23.62 kg/m     Wt Readings from Last 3 Encounters:  10/30/20 164 lb 9.6 oz (74.7 kg)  10/05/20 173 lb 15.1 oz (78.9 kg)  01/03/20 165 lb (74.8 kg)     GEN: Well nourished, well developed in no acute distress HEENT: Normal NECK: No JVD; No carotid bruits LYMPHATICS: No lymphadenopathy CARDIAC: RRR, + murmurs, rubs, gallops RESPIRATORY:  Clear to auscultation without rales, wheezing or rhonchi  ABDOMEN: Soft, non-tender, non-distended MUSCULOSKELETAL:  No edema; No deformity  SKIN: Warm and dry NEUROLOGIC:  Alert and oriented x 3 PSYCHIATRIC:  Normal affect   ASSESSMENT AND PLAN:    1. hypertrophic cardiomyopathy - Follow conservatively  2. Syncope  - Felt due to intravascular volume depletion in setting of hypertrophic cardiomyopathy - He has underlying conduction dx>> stopped bisoprolol>> HR now in 70s -Discussed adequate hydration and balance before movement  Medication Adjustments/Labs and Tests Ordered: Current medicines are reviewed at length with the patient today.  Concerns regarding medicines are outlined above.  No orders of the defined types were placed in this encounter.  No orders of the defined types were placed in this encounter.   Patient Instructions  Medication Instructions:  Your physician recommends that you continue on your current medications as directed. Please refer to the Current Medication list given to you today.  *If you need a refill on your cardiac medications before your next appointment, please call your pharmacy*   Lab Work: None ordered  If you have labs (blood work) drawn today and your tests are completely normal, you will receive your results only by: Marland Kitchen MyChart Message (if you have MyChart) OR . A paper  copy in the mail If you have any lab test that is abnormal or we need to change your treatment, we will call you to review the results.   Testing/Procedures: None ordered   Follow-Up: At [facility], you and your health needs are our priority.  As part of our continuing mission to provide you with exceptional heart care, we have created designated Provider Care Teams.  These Care Teams include your primary Cardiologist (physician) and Advanced Practice Providers (APPs -  Physician Assistants and Nurse Practitioners) who all work together to provide you with the care you need, when you need it.  We recommend signing up for the patient portal called "MyChart".  Sign up information is provided on this After Visit Summary.  MyChart is used to connect with patients for Virtual Visits (Telemedicine).  Patients are able to view lab/test results, encounter notes, upcoming appointments, etc.  Non-urgent messages can be sent to your provider as well.   To learn more about what you can do with MyChart, go to [email or web address].    Your next appointment:   12 month(s)  The format for your next appointment:   In Person  Provider:   You may see Lauree Chandler, MD or one of the following Advanced Practice Providers on your designated Care Team:    Melina Copa, PA-C  Ermalinda Barrios, PA-C    Other Instructions      Signed, Leanor Kail, [place]  10/30/2020 3:47 PM    [place]

## 2020-11-25 DIAGNOSIS — E039 Hypothyroidism, unspecified: Secondary | ICD-10-CM | POA: Diagnosis not present

## 2020-11-25 DIAGNOSIS — I421 Obstructive hypertrophic cardiomyopathy: Secondary | ICD-10-CM | POA: Diagnosis not present

## 2020-11-25 DIAGNOSIS — R2681 Unsteadiness on feet: Secondary | ICD-10-CM | POA: Diagnosis not present

## 2020-11-25 DIAGNOSIS — R55 Syncope and collapse: Secondary | ICD-10-CM | POA: Diagnosis not present

## 2020-11-25 DIAGNOSIS — N1831 Chronic kidney disease, stage 3a: Secondary | ICD-10-CM | POA: Diagnosis not present

## 2020-11-25 DIAGNOSIS — G9341 Metabolic encephalopathy: Secondary | ICD-10-CM | POA: Diagnosis not present

## 2020-11-25 DIAGNOSIS — I129 Hypertensive chronic kidney disease with stage 1 through stage 4 chronic kidney disease, or unspecified chronic kidney disease: Secondary | ICD-10-CM | POA: Diagnosis not present

## 2020-11-25 DIAGNOSIS — I959 Hypotension, unspecified: Secondary | ICD-10-CM | POA: Diagnosis not present

## 2020-11-25 DIAGNOSIS — Z7189 Other specified counseling: Secondary | ICD-10-CM | POA: Diagnosis not present

## 2021-05-14 DIAGNOSIS — H353132 Nonexudative age-related macular degeneration, bilateral, intermediate dry stage: Secondary | ICD-10-CM | POA: Diagnosis not present

## 2021-09-17 DIAGNOSIS — E781 Pure hyperglyceridemia: Secondary | ICD-10-CM | POA: Diagnosis not present

## 2021-09-17 DIAGNOSIS — M859 Disorder of bone density and structure, unspecified: Secondary | ICD-10-CM | POA: Diagnosis not present

## 2021-09-17 DIAGNOSIS — Z125 Encounter for screening for malignant neoplasm of prostate: Secondary | ICD-10-CM | POA: Diagnosis not present

## 2021-09-17 DIAGNOSIS — I10 Essential (primary) hypertension: Secondary | ICD-10-CM | POA: Diagnosis not present

## 2021-09-17 DIAGNOSIS — E039 Hypothyroidism, unspecified: Secondary | ICD-10-CM | POA: Diagnosis not present

## 2021-09-24 DIAGNOSIS — Z0000 Encounter for general adult medical examination without abnormal findings: Secondary | ICD-10-CM | POA: Diagnosis not present

## 2021-09-24 DIAGNOSIS — E039 Hypothyroidism, unspecified: Secondary | ICD-10-CM | POA: Diagnosis not present

## 2021-09-24 DIAGNOSIS — R2681 Unsteadiness on feet: Secondary | ICD-10-CM | POA: Diagnosis not present

## 2021-09-24 DIAGNOSIS — N1831 Chronic kidney disease, stage 3a: Secondary | ICD-10-CM | POA: Diagnosis not present

## 2021-09-24 DIAGNOSIS — R82998 Other abnormal findings in urine: Secondary | ICD-10-CM | POA: Diagnosis not present

## 2021-09-24 DIAGNOSIS — Z1339 Encounter for screening examination for other mental health and behavioral disorders: Secondary | ICD-10-CM | POA: Diagnosis not present

## 2021-09-24 DIAGNOSIS — Z1331 Encounter for screening for depression: Secondary | ICD-10-CM | POA: Diagnosis not present

## 2021-09-24 DIAGNOSIS — R32 Unspecified urinary incontinence: Secondary | ICD-10-CM | POA: Diagnosis not present

## 2021-09-24 DIAGNOSIS — R55 Syncope and collapse: Secondary | ICD-10-CM | POA: Diagnosis not present

## 2021-09-24 DIAGNOSIS — I421 Obstructive hypertrophic cardiomyopathy: Secondary | ICD-10-CM | POA: Diagnosis not present

## 2021-12-03 DIAGNOSIS — M8580 Other specified disorders of bone density and structure, unspecified site: Secondary | ICD-10-CM | POA: Diagnosis not present

## 2021-12-03 DIAGNOSIS — E039 Hypothyroidism, unspecified: Secondary | ICD-10-CM | POA: Diagnosis not present

## 2022-03-25 ENCOUNTER — Telehealth: Payer: Self-pay | Admitting: Cardiovascular Disease

## 2022-03-25 NOTE — Telephone Encounter (Signed)
Spoke with daughter who stated at this time her father does not wish to come in, he is not on any medications.  She said if she needs [place] she will give [place] a call.

## 2022-04-06 ENCOUNTER — Ambulatory Visit: Payer: PPO | Admitting: Cardiovascular Disease

## 2022-04-15 DIAGNOSIS — H906 Mixed conductive and sensorineural hearing loss, bilateral: Secondary | ICD-10-CM | POA: Diagnosis not present

## 2022-04-15 DIAGNOSIS — E039 Hypothyroidism, unspecified: Secondary | ICD-10-CM | POA: Diagnosis not present

## 2022-04-15 DIAGNOSIS — Z570 Occupational exposure to noise: Secondary | ICD-10-CM | POA: Diagnosis not present

## 2022-07-28 DIAGNOSIS — H608X3 Other otitis externa, bilateral: Secondary | ICD-10-CM | POA: Diagnosis not present

## 2022-07-28 DIAGNOSIS — H6123 Impacted cerumen, bilateral: Secondary | ICD-10-CM | POA: Diagnosis not present

## 2022-07-28 DIAGNOSIS — H903 Sensorineural hearing loss, bilateral: Secondary | ICD-10-CM | POA: Diagnosis not present

## 2022-09-30 DIAGNOSIS — E781 Pure hyperglyceridemia: Secondary | ICD-10-CM | POA: Diagnosis not present

## 2022-09-30 DIAGNOSIS — R739 Hyperglycemia, unspecified: Secondary | ICD-10-CM | POA: Diagnosis not present

## 2022-09-30 DIAGNOSIS — I10 Essential (primary) hypertension: Secondary | ICD-10-CM | POA: Diagnosis not present

## 2022-09-30 DIAGNOSIS — E039 Hypothyroidism, unspecified: Secondary | ICD-10-CM | POA: Diagnosis not present

## 2022-09-30 DIAGNOSIS — Z125 Encounter for screening for malignant neoplasm of prostate: Secondary | ICD-10-CM | POA: Diagnosis not present

## 2022-10-07 DIAGNOSIS — Z23 Encounter for immunization: Secondary | ICD-10-CM | POA: Diagnosis not present

## 2022-10-07 DIAGNOSIS — Z0000 Encounter for general adult medical examination without abnormal findings: Secondary | ICD-10-CM | POA: Diagnosis not present

## 2022-10-07 DIAGNOSIS — I421 Obstructive hypertrophic cardiomyopathy: Secondary | ICD-10-CM | POA: Diagnosis not present

## 2022-10-07 DIAGNOSIS — R2681 Unsteadiness on feet: Secondary | ICD-10-CM | POA: Diagnosis not present

## 2022-10-07 DIAGNOSIS — N1831 Chronic kidney disease, stage 3a: Secondary | ICD-10-CM | POA: Diagnosis not present

## 2022-10-07 DIAGNOSIS — R55 Syncope and collapse: Secondary | ICD-10-CM | POA: Diagnosis not present

## 2022-10-07 DIAGNOSIS — Z1331 Encounter for screening for depression: Secondary | ICD-10-CM | POA: Diagnosis not present

## 2022-10-07 DIAGNOSIS — R32 Unspecified urinary incontinence: Secondary | ICD-10-CM | POA: Diagnosis not present

## 2022-10-07 DIAGNOSIS — Z1339 Encounter for screening examination for other mental health and behavioral disorders: Secondary | ICD-10-CM | POA: Diagnosis not present

## 2022-10-07 DIAGNOSIS — E039 Hypothyroidism, unspecified: Secondary | ICD-10-CM | POA: Diagnosis not present

## 2022-10-19 ENCOUNTER — Encounter: Payer: Self-pay | Admitting: *Deleted

## 2022-11-17 NOTE — Progress Notes (Signed)
Cardiology Office Note:    Date:  11/25/2022   ID:  Edward Foster, DOB 1923-06-02, MRN [id number]  PCP:  Edward Penna, MD  [facility] Providers Cardiologist:  Edward Carrow, MD     Referring MD: Edward Penna, MD   Chief Complaint:  No chief complaint on file.     History of Present Illness:   Edward Foster is a 89 y.o. male with  a hx of hypertrophic cardiomyopathy, prostate cancer s/p brachytherapy and XRT, chronic LBBB, aortic stenosis and  hypothyroidism seen for hospital follow up.    He has been followed by Dr. Sharyn Foster ([facility] [place]) for idiopathic sub-aortic stenosis. No prior CAD or MI. He moved from [place] in June 2013 to be near his daughter in [place] and son in [place]. He was seen in our office 11/04/16 after a near syncopal event after starting a herbal supplement for his bladder. This medication was stopped due to anticholinergic side effects. Echo May 2018 with normal LV function, severe concentric LVH, mild to moderate AS, SAM LVOT gradient not well characterized but peak velocity in the 2.5 m/sec range, overall unchanged.  24 hour cardiac monitor with sinus rhythm, PVCs, no long pauses. He had a syncopal episode in February 2020. He was seen in our office August 2020 by Edward Newcomer, PA-C and his beta blocker dose was decreased. Cardiac monitor with several quick bursts of SVT but no pauses or heart block noted.    Admitted 10/1998 for syncope and felt likely due to intravascular volume depletion. DC bisoprolol. CT head showed possible subacute stroke. No plan for MRI after family discussion.   Last seen in our office 10/2020 and doing well.   Patient comes in with his daughter. He's very active for his age, still lives independently, takes care of his own home. His daughter brings him meals. She doesn't think he's eating enough. He's lost some weight.  He doesn't drink enough water-2-3 8 ounce glasses of water daily. Rides a recumbent bike 10  min daily.  No chest pain, short of breath dizziness, palpitations, edema.             Past Medical History:  Diagnosis Date   Heart murmur    HOH (hard of hearing)    Hypertrophic cardiomyopathy ([facility])    Echo 5/18: severe conc LVH, EF 50-55, LVOT gradient not well characterized but peak velocity 2.5 m/sec; mild to mod AS (mean 19, peak 33), mild AI, severe post MAC, mod LAE, PFO cannot be excluded   Hypothyroidism    Prostate cancer ([facility])    Current Medications: Current Meds  Medication Sig   fluticasone (FLONASE) 50 MCG/ACT nasal spray Place 1 spray into both nostrils daily.   levothyroxine (SYNTHROID, LEVOTHROID) 125 MCG tablet Take 125 mcg by mouth daily before breakfast.   multivitamin-lutein (OCUVITE-LUTEIN) CAPS capsule Take 1 capsule by mouth daily.    Allergies:   Patient has no known allergies.   Social History   Tobacco Use   Smoking status: Former    Years: 20    Types: Cigarettes    Quit date: 07/19/1973    Years since quitting: 49.3   Smokeless tobacco: Former  Building services engineer Use: Never used  Substance Use Topics   Alcohol use: Yes    Alcohol/week: 8.0 standard drinks of alcohol    Types: 7 Standard drinks or equivalent, 1 Shots of liquor per week   Drug use: No    Family Hx:  The patient's family history includes Diabetes in his mother.  ROS     Physical Exam:    VS:  BP 108/60   Pulse (!) 58   Ht 5\' 10"  (1.778 m)   Wt 152 lb (68.9 kg)   SpO2 91%   BMI 21.81 kg/m     Wt Readings from Last 3 Encounters:  11/25/22 152 lb (68.9 kg)  10/30/20 164 lb 9.6 oz (74.7 kg)  10/05/20 173 lb 15.1 oz (78.9 kg)    Physical Exam  GEN: Thin, looks younger than stated age,, in no acute distress  Neck: no JVD, carotid bruits, or masses Cardiac:RRR; 2/6 systolic murmur apex and RSB  Respiratory:  clear to auscultation bilaterally, normal work of breathing GI: soft, nontender, nondistended, + BS Ext: without cyanosis, clubbing, or edema, Good distal  pulses bilaterally Neuro:  Alert and Oriented x 3,  Psych: euthymic mood, full affect        EKGs/Labs/Other Test Reviewed:    EKG:  EKG is   ordered today.  The ekg ordered today demonstrates NSR with LBBB  Recent Labs: No results found for requested labs within last 365 days.   Recent Lipid Panel No results for input(s): "CHOL", "TRIG", "HDL", "VLDL", "LDLCALC", "LDLDIRECT" in the last 8760 hours.   Prior CV Studies: ECHO COMPLETE WO IMAGING ENHANCING AGENT 10/05/2020  Narrative ECHOCARDIOGRAM REPORT    Patient Name:   Edward Foster Date of Exam: 10/05/2020 Medical Rec #:  [id number]       Height:       71.0 in Accession #:    [id number]      Weight:       173.9 lb Date of Birth:  March 02, 1923       BSA:          1.987 m Patient Age:    89 years        BP:           112/61 mmHg Patient Gender: M               HR:           52 bpm. Exam Location:  Inpatient  Procedure: 2D Echo, Cardiac Doppler and Color Doppler  Indications:    Elevated troponin  History:        Patient has prior history of Echocardiogram examinations, most recent 11/16/2016.  Sonographer:    Roosvelt Maser, RDCS Referring Phys: [phone] ANASTASSIA DOUTOVA  IMPRESSIONS   1. Systolic anterior motion of the mitral valve noted: Peak gradient 155 mm Hg (resting). Left ventricular ejection fraction, by estimation, is 50 to 55%. The left ventricle has low normal function. The left ventricle has no regional wall motion abnormalities. There is severe concentric left ventricular hypertrophy. Left ventricular diastolic parameters are indeterminate. 2. Two jets of mitral regurgitation- one is a late systolic jet associated with LVOT obstruction. The mitral valve is degenerative. Moderate to severe mitral valve regurgitation. The mean mitral valve gradient is 3.0 mmHg with average heart rate of 53 bpm. Severe mitral annular calcification. 3. Right ventricular systolic function is normal. The right ventricular size is  normal. Moderately increased right ventricular wall thickness. 4. Left atrial size was severely dilated. 5. The tricuspid valve is degenerative. Tricuspid valve regurgitation is severe. 6. The aortic valve is tricuspid. There is severe calcifcation of the aortic valve. Aortic valve regurgitation is trivial. Mild to moderate aortic valve stenosis. 7. The inferior vena cava is dilated in size with <50% respiratory  variability, suggesting right atrial pressure of 15 mmHg.  Comparison(s): A prior study was performed on 11/16/2016. Prior images reviewed side by side. Significant increase in LVOT obstruction, MR is increased from prior, left atrium is further dilated.  FINDINGS Left Ventricle: Systolic anterior motion of the mitral valve noted: Peak gradient 155 mm Hg (resting). Left ventricular ejection fraction, by estimation, is 50 to 55%. The left ventricle has low normal function. The left ventricle has no regional wall motion abnormalities. The left ventricular internal cavity size was normal in size. There is severe concentric left ventricular hypertrophy. Left ventricular diastolic parameters are indeterminate.  Right Ventricle: The right ventricular size is normal. Moderately increased right ventricular wall thickness. Right ventricular systolic function is normal.  Left Atrium: Left atrial size was severely dilated.  Right Atrium: Right atrial size was normal in size.  Pericardium: There is no evidence of pericardial effusion.  Mitral Valve: Two jets of mitral regurgitation- one is a late systolic jet associated with LVOT obstruction. The mitral valve is degenerative in appearance. Severe mitral annular calcification. Moderate to severe mitral valve regurgitation. The mean mitral valve gradient is 3.0 mmHg with average heart rate of 53 bpm.  Tricuspid Valve: The tricuspid valve is degenerative in appearance. Tricuspid valve regurgitation is severe. No evidence of tricuspid  stenosis.  Aortic Valve: The aortic valve is tricuspid. There is severe calcifcation of the aortic valve. Aortic valve regurgitation is trivial. Mild to moderate aortic stenosis is present. Aortic valve mean gradient measures 21.0 mmHg. Aortic valve peak gradient measures 38.4 mmHg.  Pulmonic Valve: The pulmonic valve was not well visualized. Pulmonic valve regurgitation is mild. No evidence of pulmonic stenosis.  Aorta: The aortic root and ascending aorta are structurally normal, with no evidence of dilitation.  Venous: The inferior vena cava is dilated in size with less than 50% respiratory variability, suggesting right atrial pressure of 15 mmHg.  IAS/Shunts: The atrial septum is grossly normal.   LEFT VENTRICLE PLAX 2D LVIDd:         4.40 cm  Diastology LVIDs:         2.90 cm  LV e' medial:    5.59 cm/s LV PW:         1.10 cm  LV E/e' medial:  27.0 LV IVS:        1.20 cm  LV e' lateral:   8.59 cm/s LVOT diam:     2.00 cm  LV E/e' lateral: 17.6 LVOT Area:     3.14 cm   RIGHT VENTRICLE          IVC RV Basal diam:  3.85 cm  IVC diam: 2.50 cm  LEFT ATRIUM            Index       RIGHT ATRIUM           Index LA diam:      4.10 cm  2.06 cm/m  RA Area:     21.30 cm LA Vol (A2C): 188.0 ml 94.61 ml/m RA Volume:   65.20 ml  32.81 ml/m LA Vol (A4C): 157.0 ml 79.01 ml/m AORTIC VALVE AV Vmax:      310.00 cm/s AV Vmean:     220.000 cm/s AV VTI:       0.752 m AV Peak Grad: 38.4 mmHg AV Mean Grad: 21.0 mmHg  AORTA Ao Root diam: 3.60 cm  MITRAL VALVE MV Area (PHT): 4.39 cm     SHUNTS MV Mean grad:  3.0 mmHg  Systemic Diam: 2.00 cm MV Decel Time: 173 msec MV E velocity: 151.00 cm/s MV A velocity: 118.00 cm/s MV E/A ratio:  1.28  Riley Lam MD Electronically signed by Riley Lam MD Signature Date/Time: 10/05/2020/2:31:23 PM    Final        Risk Assessment/Calculations/Metrics:              ASSESSMENT & PLAN:   No problem-specific  Assessment & Plan notes found for this encounter.   Hypertrophic cardiomyopathy - Follow conservatively    History of Syncope -no recurrence. - Felt due to intravascular volume depletion in setting of hypertrophic cardiomyopathy - He has underlying conduction dx>> stopped bisoprolol>> HR now in 70s -Discussed adequate hydration and balance before movement  Moderate to severe MR and mild to mod AS-asymptomatic. Long discussion with patient and daughter. He is DNR and remains very active. Will not order echo or further testing unless he becomes symptomatic or wants to consider treatment.            Dispo:  No follow-ups on file.   Medication Adjustments/Labs and Tests Ordered: Current medicines are reviewed at length with the patient today.  Concerns regarding medicines are outlined above.  Tests Ordered: Orders Placed This Encounter  Procedures   EKG 12-Lead   Medication Changes: No orders of the defined types were placed in this encounter.  Edward Clan, PA-C  11/25/2022 10:19 AM    [facility] [address] [place], [place], [place]  [address] Phone: [phone]; Fax: [phone]

## 2022-11-25 ENCOUNTER — Encounter: Payer: Self-pay | Admitting: Physician Assistant

## 2022-11-25 ENCOUNTER — Ambulatory Visit: Payer: PPO | Attending: Physician Assistant | Admitting: Physician Assistant

## 2022-11-25 VITALS — BP 108/60 | HR 58 | Ht 70.0 in | Wt 152.0 lb

## 2022-11-25 DIAGNOSIS — Z87898 Personal history of other specified conditions: Secondary | ICD-10-CM

## 2022-11-25 DIAGNOSIS — I350 Nonrheumatic aortic (valve) stenosis: Secondary | ICD-10-CM

## 2022-11-25 DIAGNOSIS — I340 Nonrheumatic mitral (valve) insufficiency: Secondary | ICD-10-CM | POA: Diagnosis not present

## 2022-11-25 DIAGNOSIS — I421 Obstructive hypertrophic cardiomyopathy: Secondary | ICD-10-CM

## 2022-11-25 NOTE — Patient Instructions (Signed)
Medication Instructions:  Your physician recommends that you continue on your current medications as directed. Please refer to the Current Medication list given to you today.  *If you need a refill on your cardiac medications before your next appointment, please call your pharmacy*   Lab Work: None ordered   If you have labs (blood work) drawn today and your tests are completely normal, you will receive your results only by: MyChart Message (if you have MyChart) OR A paper copy in the mail If you have any lab test that is abnormal or we need to change your treatment, we will call you to review the results.   Testing/Procedures: None ordered    Follow-Up: At [facility], you and your health needs are our priority.  As part of our continuing mission to provide you with exceptional heart care, we have created designated Provider Care Teams.  These Care Teams include your primary Cardiologist (physician) and Advanced Practice Providers (APPs -  Physician Assistants and Nurse Practitioners) who all work together to provide you with the care you need, when you need it.  We recommend signing up for the patient portal called "MyChart".  Sign up information is provided on this After Visit Summary.  MyChart is used to connect with patients for Virtual Visits (Telemedicine).  Patients are able to view lab/test results, encounter notes, upcoming appointments, etc.  Non-urgent messages can be sent to your provider as well.   To learn more about what you can do with MyChart, go to [email or web address].    Your next appointment:   12 month(s)  Provider:   Christopher McAlhany, MD     Other Instructions   

## 2023-04-15 DIAGNOSIS — R2681 Unsteadiness on feet: Secondary | ICD-10-CM | POA: Diagnosis not present

## 2023-04-15 DIAGNOSIS — E039 Hypothyroidism, unspecified: Secondary | ICD-10-CM | POA: Diagnosis not present

## 2023-04-15 DIAGNOSIS — R32 Unspecified urinary incontinence: Secondary | ICD-10-CM | POA: Diagnosis not present

## 2023-04-15 DIAGNOSIS — G3184 Mild cognitive impairment, so stated: Secondary | ICD-10-CM | POA: Diagnosis not present

## 2023-04-15 DIAGNOSIS — N1831 Chronic kidney disease, stage 3a: Secondary | ICD-10-CM | POA: Diagnosis not present

## 2023-04-15 DIAGNOSIS — N183 Chronic kidney disease, stage 3 unspecified: Secondary | ICD-10-CM | POA: Diagnosis not present

## 2023-04-15 DIAGNOSIS — R410 Disorientation, unspecified: Secondary | ICD-10-CM | POA: Diagnosis not present

## 2023-04-15 DIAGNOSIS — I421 Obstructive hypertrophic cardiomyopathy: Secondary | ICD-10-CM | POA: Diagnosis not present

## 2023-04-15 DIAGNOSIS — I10 Essential (primary) hypertension: Secondary | ICD-10-CM | POA: Diagnosis not present

## 2023-06-01 DIAGNOSIS — H353131 Nonexudative age-related macular degeneration, bilateral, early dry stage: Secondary | ICD-10-CM | POA: Diagnosis not present

## 2024-05-31 DIAGNOSIS — Z9841 Cataract extraction status, right eye: Secondary | ICD-10-CM | POA: Diagnosis not present

## 2024-05-31 DIAGNOSIS — H353132 Nonexudative age-related macular degeneration, bilateral, intermediate dry stage: Secondary | ICD-10-CM | POA: Diagnosis not present

## 2024-05-31 DIAGNOSIS — H353131 Nonexudative age-related macular degeneration, bilateral, early dry stage: Secondary | ICD-10-CM | POA: Diagnosis not present
